# Patient Record
Sex: Male | Born: 1960 | Race: Black or African American | Hispanic: No | Smoking: Former smoker
Health system: Southern US, Community
[De-identification: ages and names within clinical notes are randomized; demographics above are authoritative.]

## PROBLEM LIST (undated history)

## (undated) DIAGNOSIS — G43909 Migraine, unspecified, not intractable, without status migrainosus: Secondary | ICD-10-CM

## (undated) DIAGNOSIS — H8110 Benign paroxysmal vertigo, unspecified ear: Secondary | ICD-10-CM

## (undated) DIAGNOSIS — Z8619 Personal history of other infectious and parasitic diseases: Secondary | ICD-10-CM

## (undated) DIAGNOSIS — N200 Calculus of kidney: Secondary | ICD-10-CM

## (undated) HISTORY — DX: Benign paroxysmal vertigo, unspecified ear: H81.10

## (undated) HISTORY — DX: Migraine, unspecified, not intractable, without status migrainosus: G43.909

## (undated) HISTORY — DX: Calculus of kidney: N20.0

## (undated) HISTORY — DX: Personal history of other infectious and parasitic diseases: Z86.19

---

## 1988-10-12 HISTORY — PX: VARICOSE VEIN SURGERY: SHX832

## 2007-03-10 DIAGNOSIS — Z720 Tobacco use: Secondary | ICD-10-CM | POA: Insufficient documentation

## 2007-03-10 DIAGNOSIS — R636 Underweight: Secondary | ICD-10-CM | POA: Insufficient documentation

## 2009-03-27 DIAGNOSIS — H8110 Benign paroxysmal vertigo, unspecified ear: Secondary | ICD-10-CM

## 2009-03-27 HISTORY — DX: Benign paroxysmal vertigo, unspecified ear: H81.10

## 2009-04-03 ENCOUNTER — Emergency Department: Payer: Self-pay | Admitting: Emergency Medicine

## 2009-04-03 ENCOUNTER — Ambulatory Visit: Payer: Self-pay | Admitting: Family Medicine

## 2009-04-11 HISTORY — PX: BRAIN SURGERY: SHX531

## 2012-03-10 ENCOUNTER — Ambulatory Visit: Payer: Self-pay | Admitting: Otolaryngology

## 2012-10-27 ENCOUNTER — Ambulatory Visit: Payer: Self-pay | Admitting: Gastroenterology

## 2012-10-28 LAB — HM COLONOSCOPY

## 2012-10-29 LAB — PATHOLOGY REPORT

## 2012-11-02 ENCOUNTER — Ambulatory Visit: Payer: Self-pay

## 2013-10-21 LAB — BASIC METABOLIC PANEL
BUN: 12 mg/dL (ref 4–21)
Creatinine: 1 mg/dL (ref 0.6–1.3)
GLUCOSE: 80 mg/dL
Potassium: 5 mmol/L (ref 3.4–5.3)
Sodium: 141 mmol/L (ref 137–147)

## 2013-10-21 LAB — CBC AND DIFFERENTIAL
HEMATOCRIT: 47 % (ref 41–53)
Hemoglobin: 16 g/dL (ref 13.5–17.5)
PLATELETS: 260 10*3/uL (ref 150–399)
WBC: 8.2 10^3/mL

## 2013-10-21 LAB — TSH: TSH: 1.39 u[IU]/mL (ref 0.41–5.90)

## 2013-10-21 LAB — PSA: PSA: 0.9

## 2014-12-16 DIAGNOSIS — N492 Inflammatory disorders of scrotum: Secondary | ICD-10-CM | POA: Insufficient documentation

## 2014-12-16 DIAGNOSIS — IMO0002 Reserved for concepts with insufficient information to code with codable children: Secondary | ICD-10-CM

## 2014-12-16 DIAGNOSIS — G43909 Migraine, unspecified, not intractable, without status migrainosus: Secondary | ICD-10-CM | POA: Insufficient documentation

## 2014-12-16 DIAGNOSIS — Z87442 Personal history of urinary calculi: Secondary | ICD-10-CM | POA: Insufficient documentation

## 2014-12-16 DIAGNOSIS — L309 Dermatitis, unspecified: Secondary | ICD-10-CM | POA: Insufficient documentation

## 2014-12-16 DIAGNOSIS — N402 Nodular prostate without lower urinary tract symptoms: Secondary | ICD-10-CM | POA: Insufficient documentation

## 2014-12-16 DIAGNOSIS — D496 Neoplasm of unspecified behavior of brain: Secondary | ICD-10-CM | POA: Insufficient documentation

## 2014-12-16 HISTORY — DX: Reserved for concepts with insufficient information to code with codable children: IMO0002

## 2014-12-20 ENCOUNTER — Encounter: Payer: Self-pay | Admitting: Family Medicine

## 2014-12-20 ENCOUNTER — Ambulatory Visit (INDEPENDENT_AMBULATORY_CARE_PROVIDER_SITE_OTHER): Payer: PRIVATE HEALTH INSURANCE | Admitting: Family Medicine

## 2014-12-20 VITALS — BP 114/80 | HR 60 | Temp 98.3°F | Resp 16 | Ht 71.0 in | Wt 128.0 lb

## 2014-12-20 DIAGNOSIS — Z720 Tobacco use: Secondary | ICD-10-CM | POA: Diagnosis not present

## 2014-12-20 DIAGNOSIS — Z0000 Encounter for general adult medical examination without abnormal findings: Secondary | ICD-10-CM | POA: Diagnosis not present

## 2014-12-20 DIAGNOSIS — Z86010 Personal history of colonic polyps: Secondary | ICD-10-CM | POA: Insufficient documentation

## 2014-12-20 DIAGNOSIS — H6123 Impacted cerumen, bilateral: Secondary | ICD-10-CM | POA: Diagnosis not present

## 2014-12-20 DIAGNOSIS — Z860101 Personal history of adenomatous and serrated colon polyps: Secondary | ICD-10-CM | POA: Insufficient documentation

## 2014-12-20 NOTE — Progress Notes (Signed)
Patient: Jeffery Grant, Male    DOB: November 14, 1960, 54 y.o.   MRN: [id number] Visit Date: 12/20/2014  Today's Provider: Lelon Huh, MD   Chief Complaint  Patient presents with  . Annual Exam   Subjective:    Annual physical exam Jeffery Grant is a 54 y.o. male who presents today for health maintenance and complete physical. He feels well. He reports exercising no. He reports he is sleeping poorly.  -----------------------------------------------------------------   Review of Systems  Constitutional: Negative.   HENT:       Ear fullness, popping, crackling  Eyes: Negative.   Respiratory: Negative.   Cardiovascular: Negative.   Gastrointestinal: Negative.   Endocrine: Negative.   Genitourinary: Negative.   Musculoskeletal: Negative.   Skin: Negative.   Allergic/Immunologic: Negative.   Neurological: Negative.   Hematological: Negative.   Psychiatric/Behavioral: Positive for sleep disturbance.       Wakes up several times a night, has been going on for around 9 years    Social History He  reports that he has been smoking Cigarettes.  He has a 30 pack-year smoking history. He does not have any smokeless tobacco history on file. He reports that he drinks alcohol. He reports that he does not use illicit drugs. Social History   Social History  . Marital Status: Married    Spouse Name: N/A  . Number of Children: 1  . Years of Education: N/A   Occupational History  . 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 Smoking status: Current Every Day Smoker -- 1.00 packs/day for 30 years    Types: Cigarettes  . Smokeless tobacco: None  . Alcohol Use: 0.0 oz/week    0 Standard drinks or equivalent per week     Comment: occasional use  . Drug Use: No  . Sexual Activity: Not Asked   Other Topics Concern  . None   Social History Narrative    Patient Active Problem List   Diagnosis Date Noted  . Scrotal nodule  12/16/2014  . 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 Dermatitis 12/16/2014  . History of kidney stones 12/16/2014  . Lesion of mucosa 12/16/2014  . Migraine 12/16/2014  . Prostate nodule 12/16/2014  . Benign paroxysmal positional vertigo 03/27/2009  . Family history of prostate cancer 08/02/2008  . Underweight 03/10/2007  . Current tobacco use 03/10/2007  . Insomnia 03/10/2007    Past Surgical History  Procedure Laterality Date  . Brain surgery  04/2009    Brain Tumor Cerebellar lesion, excised at [facility]   . Varicose vein surgery  1990's    Family History  Family Status  Relation Status Death Age  . Mother Alive     Had a Brain tumor  . Father Deceased 18    Lung cancer   His family history includes Hypertension in his other; Lung cancer in his father.    Allergies  Allergen Reactions  . Penicillins     shot form    Previous Medications   No medications on file    Patient Care Team: Birdie Sons, MD as PCP - General (Family Medicine)     Objective:   Vitals: BP 114/80 mmHg  Pulse 60  Temp(Src) 98.3 F (36.8 C) (Oral)  Resp 16  Ht 5\' 11"  (1.803 m)  Wt 128 lb (58.06 kg)  BMI 17.86 kg/m2  SpO2 99%   Physical Exam   General Appearance:  Alert, cooperative, no distress, appears stated age  Head:    Normocephalic, without obvious abnormality, atraumatic  Eyes:    PERRL, conjunctiva/corneas clear, EOM's intact, fundi    benign, both eyes       Ears:    Cerumen impaction bilaterally. Right ear canal cleared due to discomfort. Normal TM and canal after disimpaction.   Nose:   Nares normal, septum midline, mucosa normal, no drainage   or sinus tenderness  Throat:   Lips, mucosa, and tongue normal; teeth and gums normal  Neck:   Supple, symmetrical, trachea midline, no adenopathy;       thyroid:  No enlargement/tenderness/nodules; no carotid   bruit or JVD  Back:     Symmetric, no curvature, ROM normal, no CVA tenderness  Lungs:     Clear to auscultation  bilaterally, respirations unlabored  Chest wall:    No tenderness or deformity  Heart:    Regular rate and rhythm, S1 and S2 normal, no murmur, rub   or gallop  Abdomen:     Soft, non-tender, bowel sounds active all four quadrants,    no masses, no organomegaly  Genitalia:    deferred  Rectal:    deferred  Extremities:   Extremities normal, atraumatic, no cyanosis or edema  Pulses:   2+ and symmetric all extremities  Skin:   Skin color, texture, turgor normal, no rashes or lesions  Lymph nodes:   Cervical, supraclavicular, and axillary nodes normal  Neurologic:   CNII-XII intact. Normal strength, sensation and reflexes      throughout   Depression Screen PHQ 2/9 Scores 12/20/2014  PHQ - 2 Score 0  PHQ- 9 Score 0      Assessment & Plan:     Routine Health Maintenance and Physical Exam  Exercise Activities and Dietary recommendations Goals    None      Immunization History  Administered Date(s) Administered  . Tdap 07/14/2009    Health Maintenance  Topic Date Due  . HIV Screening  06/29/1977  . INFLUENZA VACCINE  12/20/2015 (Originally 09/12/2014)  . TETANUS/TDAP  07/15/2019  . COLONOSCOPY  10/29/2022  . Hepatitis C Screening  Completed      Discussed health benefits of physical activity, and encouraged him to engage in regular exercise appropriate for his age and condition.    -------------------------------------------------------------------- 1. Annual physical exam  - PSA - Comprehensive metabolic panel - Lipid panel  2. Current tobacco use Counseled on health risks associated with smoking and advised to call for prescription Zyban if he decides to try it.   3. Cerumen impaction, bilateral, Right ear discomfort Right ear irrigated until completely cleared

## 2014-12-21 LAB — LIPID PANEL
Chol/HDL Ratio: 2.6 ratio units (ref 0.0–5.0)
Cholesterol, Total: 135 mg/dL (ref 100–199)
HDL: 52 mg/dL (ref >39)
LDL CALC: 73 mg/dL (ref 0–99)
TRIGLYCERIDES: 50 mg/dL (ref 0–149)
VLDL CHOLESTEROL CAL: 10 mg/dL (ref 5–40)

## 2014-12-21 LAB — COMPREHENSIVE METABOLIC PANEL
ALT: 16 IU/L (ref 0–44)
AST: 22 IU/L (ref 0–40)
Albumin/Globulin Ratio: 2 (ref 1.1–2.5)
Albumin: 4.6 g/dL (ref 3.5–5.5)
Alkaline Phosphatase: 63 IU/L (ref 39–117)
BUN/Creatinine Ratio: 18 (ref 9–20)
BUN: 14 mg/dL (ref 6–24)
Bilirubin Total: <0.2 mg/dL (ref 0.0–1.2)
CO2: 25 mmol/L (ref 18–29)
Calcium: 9.3 mg/dL (ref 8.7–10.2)
Chloride: 102 mmol/L (ref 97–106)
Creatinine, Ser: 0.79 mg/dL (ref 0.76–1.27)
GFR calc Af Amer: 119 mL/min/{1.73_m2} (ref >59)
GFR, EST NON AFRICAN AMERICAN: 103 mL/min/{1.73_m2} (ref >59)
GLOBULIN, TOTAL: 2.3 g/dL (ref 1.5–4.5)
Glucose: 84 mg/dL (ref 65–99)
Potassium: 4 mmol/L (ref 3.5–5.2)
SODIUM: 142 mmol/L (ref 136–144)
Total Protein: 6.9 g/dL (ref 6.0–8.5)

## 2014-12-21 LAB — PSA: Prostate Specific Ag, Serum: 1 ng/mL (ref 0.0–4.0)

## 2015-12-20 ENCOUNTER — Encounter: Payer: Self-pay | Admitting: Emergency Medicine

## 2015-12-20 ENCOUNTER — Emergency Department
Admission: EM | Admit: 2015-12-20 | Discharge: 2015-12-20 | Disposition: A | Discharge status: Home / Self Care | Payer: PRIVATE HEALTH INSURANCE | Attending: Emergency Medicine | Admitting: Emergency Medicine

## 2015-12-20 ENCOUNTER — Emergency Department: Payer: PRIVATE HEALTH INSURANCE

## 2015-12-20 DIAGNOSIS — R51 Headache: Secondary | ICD-10-CM | POA: Diagnosis not present

## 2015-12-20 DIAGNOSIS — S0990XA Unspecified injury of head, initial encounter: Secondary | ICD-10-CM | POA: Diagnosis present

## 2015-12-20 DIAGNOSIS — Y999 Unspecified external cause status: Secondary | ICD-10-CM | POA: Diagnosis not present

## 2015-12-20 DIAGNOSIS — Y9389 Activity, other specified: Secondary | ICD-10-CM | POA: Insufficient documentation

## 2015-12-20 DIAGNOSIS — F17210 Nicotine dependence, cigarettes, uncomplicated: Secondary | ICD-10-CM | POA: Insufficient documentation

## 2015-12-20 DIAGNOSIS — Z85841 Personal history of malignant neoplasm of brain: Secondary | ICD-10-CM | POA: Insufficient documentation

## 2015-12-20 DIAGNOSIS — Y92410 Unspecified street and highway as the place of occurrence of the external cause: Secondary | ICD-10-CM | POA: Diagnosis not present

## 2015-12-20 MED ORDER — MELOXICAM 7.5 MG PO TABS: 7.5000 mg | ORAL_TABLET | Freq: Every day | ORAL | 2 refills | Status: DC

## 2015-12-20 MED ORDER — CYCLOBENZAPRINE HCL 5 MG PO TABS: 5.0000 mg | ORAL_TABLET | Freq: Three times a day (TID) | ORAL | 0 refills | Status: DC | PRN

## 2015-12-20 NOTE — ED Notes (Signed)
Pt returned from CT ambulatory.  

## 2015-12-20 NOTE — ED Notes (Signed)
Pt ambulatory to CT with steady gait noted.
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
## 2016-02-02 ENCOUNTER — Encounter: Payer: Self-pay | Admitting: Family Medicine

## 2016-02-02 ENCOUNTER — Ambulatory Visit (INDEPENDENT_AMBULATORY_CARE_PROVIDER_SITE_OTHER): Payer: PRIVATE HEALTH INSURANCE | Admitting: Family Medicine

## 2016-02-02 VITALS — BP 110/62 | Temp 98.9°F | Resp 16 | Ht 71.0 in | Wt 127.0 lb

## 2016-02-02 DIAGNOSIS — Z0000 Encounter for general adult medical examination without abnormal findings: Secondary | ICD-10-CM

## 2016-02-02 NOTE — Progress Notes (Signed)
Patient: Jeffery Grant, Male    DOB: 01/01/1961, 55 y.o.   MRN: [id number] Visit Date: 02/02/2016  Today's Provider: Lelon Huh, MD   Chief Complaint  Patient presents with  . Annual Exam  . Nicotine Dependence    follow up   Subjective:    Annual physical exam Jeffery Grant is a 55 y.o. male who presents today for health maintenance and complete physical. He feels well. He reports never exercising. He reports he is sleeping well.  ----------------------------------------------------------------- Follow up Tobacco use:  Patient was last seen for this problem 1 year ago. No changes were made during this visit,.. Patient was encouraged to quit smoking. Patient has tried to quit smoking since the last visit. Patient is currently using electronic cigarettes. He reports his interest in smoking cessation is high.    Wt Readings from Last 3 Encounters:  02/02/16 127 lb (57.6 kg)  12/20/15 127 lb (57.6 kg)  12/20/14 128 lb (58.1 kg)    Review of Systems  Constitutional: Negative for appetite change, chills, fatigue and fever.  HENT: Negative for congestion, ear pain, hearing loss, nosebleeds and trouble swallowing.   Eyes: Negative for pain and visual disturbance.  Respiratory: Negative for cough, chest tightness and shortness of breath.   Cardiovascular: Negative for chest pain, palpitations and leg swelling.  Gastrointestinal: Negative for abdominal pain, blood in stool, constipation, diarrhea, nausea and vomiting.  Endocrine: Negative for polydipsia, polyphagia and polyuria.  Genitourinary: Negative for dysuria and flank pain.  Musculoskeletal: Negative for arthralgias, back pain, joint swelling, myalgias and neck stiffness.  Skin: Negative for color change, rash and wound.  Neurological: Negative for dizziness, tremors, seizures, speech difficulty, weakness, light-headedness and headaches.  Psychiatric/Behavioral: Negative for behavioral problems, confusion,  decreased concentration, dysphoric mood and sleep disturbance. The patient is not nervous/anxious.   All other systems reviewed and are negative.   Social History      He  reports that he has been smoking Cigarettes.  He has a 30.00 pack-year smoking history. He has never used smokeless tobacco. He reports that he drinks alcohol. He reports that he does not use drugs.       Social History   Social History  . Marital status: Married    Spouse name: N/A  . Number of children: 1  . Years of education: N/A   Occupational History  . 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 Smoking status: Current Every Day Smoker    Packs/day: 1.00    Years: 30.00    Types: Cigarettes  . Smokeless tobacco: Never Used  . Alcohol use 0.0 oz/week     Comment: occasional use  . Drug use: No  . Sexual activity: Not Asked   Other Topics Concern  . None   Social History Narrative  . None    Past Medical History:  Diagnosis Date  . Benign paroxysmal positional vertigo 03/27/2009  . History of chicken pox   . Kidney stone   . Migraine      Patient Active Problem List   Diagnosis Date Noted  . History of adenomatous polyp of colon 12/20/2014  . Scrotal nodule 12/16/2014  . 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 Dermatitis 12/16/2014  . History of kidney stones 12/16/2014  . Lesion of mucosa 12/16/2014  . Migraine 12/16/2014  . Prostate nodule 12/16/2014  . Family history of prostate cancer 08/02/2008  . Underweight 03/10/2007  .  Current tobacco use 03/10/2007  . Insomnia 03/10/2007    Past Surgical History:  Procedure Laterality Date  . BRAIN SURGERY  04/2009   C[facility]bellar tumor [facility] at [facility]   . VARICOSE VEIN SURGERY  1990's    Family History        Family Status  Relation Status  . Mother Alive   Had a Brain tumor  . Father Deceased at age 71   Lung cancer  . Other         His family history includes Hypertension in his other; Lung  cancer in his father.     Allergies  Allergen Reactions  . Penicillins     shot form    No current outpatient prescriptions on file.   Patient Care Team: Birdie Sons, MD as PCP - General (Family Medicine)      Objective:   Vitals: BP 110/62 (BP Location: Left Arm, Patient Position: Sitting, Cuff Size: Normal)   Temp 98.9 F (37.2 C) (Oral)   Resp 16   Ht 5\' 11"  (1.803 m)   Wt 127 lb (57.6 kg)   BMI 17.71 kg/m    Physical Exam   General Appearance:    Alert, cooperative, no distress, appears stated age  Head:    Normocephalic, without obvious abnormality, atraumatic  Eyes:    PERRL, conjunctiva/corneas clear, EOM's intact, fundi    benign, both eyes       Ears:    Normal TM's and external ear canals, both ears  Nose:   Nares normal, septum midline, mucosa normal, no drainage   or sinus tenderness  Throat:   Lips, mucosa, and tongue normal; teeth and gums normal  Neck:   Supple, symmetrical, trachea midline, no adenopathy;       thyroid:  No enlargement/tenderness/nodules; no carotid   bruit or JVD  Back:     Symmetric, no curvature, ROM normal, no CVA tenderness  Lungs:     Clear to auscultation bilaterally, respirations unlabored  Chest wall:    No tenderness or deformity  Heart:    Regular rate and rhythm, S1 and S2 normal, no murmur, rub   or gallop  Abdomen:     Soft, non-tender, bowel sounds active all four quadrants,    no masses, no organomegaly  Genitalia:    deferred  Rectal:    deferred  Extremities:   Extremities normal, atraumatic, no cyanosis or edema  Pulses:   2+ and symmetric all extremities  Skin:   Skin color, texture, turgor normal, no rashes or lesions  Lymph nodes:   Cervical, supraclavicular, and axillary nodes normal  Neurologic:   CNII-XII intact. Normal strength, sensation and reflexes      throughout    Depression Screen PHQ 2/9 Scores 02/02/2016 12/20/2014  PHQ - 2 Score 0 0  PHQ- 9 Score 0 2   Current Exercise Habits: The  patient does not participate in regular exercise at present Exercise limited by: None identified     Assessment & Plan:     Routine Health Maintenance and Physical Exam  Exercise Activities and Dietary recommendations Goals    None      Immunization History  Administered Date(s) Administered  . Tdap 07/14/2009    Health Maintenance  Topic Date Due  . HIV Screening  06/29/1977  . INFLUENZA VACCINE  09/12/2015  . TETANUS/TDAP  07/15/2019  . COLONOSCOPY  10/29/2022  . Hepatitis C Screening  Completed     Discussed health benefits of physical  activity, and encouraged him to engage in regular exercise appropriate for his age and condition.    -------------------------------------------------------------------- Refused flu vaccine.   1. Annual physical exam  - Comprehensive metabolic panel - PSA  The entirety of the information documented in the History of Present Illness, Review of Systems and Physical Exam were personally obtained by me. Portions of this information were initially documented by Meyer Cory, CMA and reviewed by me for thoroughness and accuracy.     Lelon Huh, MD  [facility]

## 2016-02-02 NOTE — Patient Instructions (Signed)
Preventive Care 40-64 Years, Male Preventive care refers to lifestyle choices and visits with your health care provider that can promote health and wellness. What does preventive care include?  A yearly physical exam. This is also called an annual well check.  Dental exams once or twice a year.  Routine eye exams. Ask your health care provider how often you should have your eyes checked.  Personal lifestyle choices, including:  Daily care of your teeth and gums.  Regular physical activity.  Eating a healthy diet.  Avoiding tobacco and drug use.  Limiting alcohol use.  Practicing safe sex.  Taking low-dose aspirin every day starting at age 50. What happens during an annual well check? The services and screenings done by your health care provider during your annual well check will depend on your age, overall health, lifestyle risk factors, and family history of disease. Counseling  Your health care provider may ask you questions about your:  Alcohol use.  Tobacco use.  Drug use.  Emotional well-being.  Home and relationship well-being.  Sexual activity.  Eating habits.  Work and work environment. Screening  You may have the following tests or measurements:  Height, weight, and BMI.  Blood pressure.  Lipid and cholesterol levels. These may be checked every 5 years, or more frequently if you are over 50 years old.  Skin check.  Lung cancer screening. You may have this screening every year starting at age 55 if you have a 30-pack-year history of smoking and currently smoke or have quit within the past 15 years.  Fecal occult blood test (FOBT) of the stool. You may have this test every year starting at age 50.  Flexible sigmoidoscopy or colonoscopy. You may have a sigmoidoscopy every 5 years or a colonoscopy every 10 years starting at age 50.  Prostate cancer screening. Recommendations will vary depending on your family history and other risks.  Hepatitis C  blood test.  Hepatitis B blood test.  Sexually transmitted disease (STD) testing.  Diabetes screening. This is done by checking your blood sugar (glucose) after you have not eaten for a while (fasting). You may have this done every 1-3 years. Discuss your test results, treatment options, and if necessary, the need for more tests with your health care provider. Vaccines  Your health care provider may recommend certain vaccines, such as:  Influenza vaccine. This is recommended every year.  Tetanus, diphtheria, and acellular pertussis (Tdap, Td) vaccine. You may need a Td booster every 10 years.  Varicella vaccine. You may need this if you have not been vaccinated.  Zoster vaccine. You may need this after age 60.  Measles, mumps, and rubella (MMR) vaccine. You may need at least one dose of MMR if you were born in 1957 or later. You may also need a second dose.  Pneumococcal 13-valent conjugate (PCV13) vaccine. You may need this if you have certain conditions and have not been vaccinated.  Pneumococcal polysaccharide (PPSV23) vaccine. You may need one or two doses if you smoke cigarettes or if you have certain conditions.  Meningococcal vaccine. You may need this if you have certain conditions.  Hepatitis A vaccine. You may need this if you have certain conditions or if you travel or work in places where you may be exposed to hepatitis A.  Hepatitis B vaccine. You may need this if you have certain conditions or if you travel or work in places where you may be exposed to hepatitis B.  Haemophilus influenzae type b (Hib)   vaccine. You may need this if you have certain risk factors. Talk to your health care provider about which screenings and vaccines you need and how often you need them. This information is not intended to replace advice given to you by your health care provider. Make sure you discuss any questions you have with your health care provider. Document Released: 02/24/2015  Document Revised: 10/18/2015 Document Reviewed: 11/29/2014 Elsevier Interactive Patient Education  2017 [employer].

## 2016-02-03 LAB — COMPREHENSIVE METABOLIC PANEL
ALBUMIN: 4.7 g/dL (ref 3.5–5.5)
ALK PHOS: 66 IU/L (ref 39–117)
ALT: 12 IU/L (ref 0–44)
AST: 21 IU/L (ref 0–40)
Albumin/Globulin Ratio: 2 (ref 1.2–2.2)
BUN / CREAT RATIO: 11 (ref 9–20)
BUN: 11 mg/dL (ref 6–24)
Bilirubin Total: 0.4 mg/dL (ref 0.0–1.2)
CALCIUM: 9.6 mg/dL (ref 8.7–10.2)
CO2: 26 mmol/L (ref 18–29)
CREATININE: 0.96 mg/dL (ref 0.76–1.27)
Chloride: 104 mmol/L (ref 96–106)
GFR calc Af Amer: 104 mL/min/{1.73_m2} (ref >59)
GFR calc non Af Amer: 90 mL/min/{1.73_m2} (ref >59)
GLUCOSE: 87 mg/dL (ref 65–99)
Globulin, Total: 2.4 g/dL (ref 1.5–4.5)
Potassium: 5.4 mmol/L — ABNORMAL HIGH (ref 3.5–5.2)
Sodium: 143 mmol/L (ref 134–144)
Total Protein: 7.1 g/dL (ref 6.0–8.5)

## 2016-02-03 LAB — PSA: PROSTATE SPECIFIC AG, SERUM: 1 ng/mL (ref 0.0–4.0)

## 2016-02-06 NOTE — Progress Notes (Signed)
Advised  ED 

## 2018-02-23 ENCOUNTER — Telehealth: Payer: Self-pay

## 2018-02-23 NOTE — Telephone Encounter (Signed)
Patient reports he has not seen for over a year. Patient reports that he needs an MRI follow up for his brain tumor. Patient reports that he is having visual changes at times. Patient also c/o sharp pain in head with cough. Patient is scheduled for appt tomorrow at 8:20 am.

## 2018-02-24 ENCOUNTER — Ambulatory Visit: Payer: Self-pay | Admitting: Family Medicine

## 2018-02-24 ENCOUNTER — Encounter: Payer: Self-pay | Admitting: Family Medicine

## 2018-02-24 VITALS — BP 154/92 | HR 81 | Temp 98.6°F | Resp 16 | Wt 125.0 lb

## 2018-02-24 DIAGNOSIS — G4452 New daily persistent headache (NDPH): Secondary | ICD-10-CM | POA: Diagnosis not present

## 2018-02-24 DIAGNOSIS — Z125 Encounter for screening for malignant neoplasm of prostate: Secondary | ICD-10-CM

## 2018-02-24 DIAGNOSIS — H539 Unspecified visual disturbance: Secondary | ICD-10-CM

## 2018-02-24 DIAGNOSIS — D496 Neoplasm of unspecified behavior of brain: Secondary | ICD-10-CM | POA: Diagnosis not present

## 2018-02-24 DIAGNOSIS — R42 Dizziness and giddiness: Secondary | ICD-10-CM | POA: Diagnosis not present

## 2018-02-24 DIAGNOSIS — H499 Unspecified paralytic strabismus: Secondary | ICD-10-CM

## 2018-02-24 NOTE — Patient Instructions (Signed)
.   Please bring all of your medications to every appointment so we can make sure that our medication list is the same as yours.   
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
## 2018-02-25 LAB — CBC
HEMATOCRIT: 45.3 % (ref 37.5–51.0)
Hemoglobin: 15.3 g/dL (ref 13.0–17.7)
MCH: 30.7 pg (ref 26.6–33.0)
MCHC: 33.8 g/dL (ref 31.5–35.7)
MCV: 91 fL (ref 79–97)
PLATELETS: 221 10*3/uL (ref 150–450)
RBC: 4.98 x10E6/uL (ref 4.14–5.80)
RDW: 13.1 % (ref 11.6–15.4)
WBC: 7.7 10*3/uL (ref 3.4–10.8)

## 2018-02-25 LAB — COMPREHENSIVE METABOLIC PANEL
ALBUMIN: 4.7 g/dL (ref 3.5–5.5)
ALK PHOS: 66 IU/L (ref 39–117)
ALT: 13 IU/L (ref 0–44)
AST: 21 IU/L (ref 0–40)
Albumin/Globulin Ratio: 2.1 (ref 1.2–2.2)
BILIRUBIN TOTAL: 0.4 mg/dL (ref 0.0–1.2)
BUN / CREAT RATIO: 11 (ref 9–20)
BUN: 12 mg/dL (ref 6–24)
CO2: 23 mmol/L (ref 20–29)
Calcium: 9.5 mg/dL (ref 8.7–10.2)
Chloride: 102 mmol/L (ref 96–106)
Creatinine, Ser: 1.05 mg/dL (ref 0.76–1.27)
GFR calc Af Amer: 92 mL/min/{1.73_m2} (ref >59)
GFR calc non Af Amer: 80 mL/min/{1.73_m2} (ref >59)
GLUCOSE: 87 mg/dL (ref 65–99)
Globulin, Total: 2.2 g/dL (ref 1.5–4.5)
Potassium: 4.4 mmol/L (ref 3.5–5.2)
SODIUM: 140 mmol/L (ref 134–144)
Total Protein: 6.9 g/dL (ref 6.0–8.5)

## 2018-02-25 LAB — PSA: Prostate Specific Ag, Serum: 1 ng/mL (ref 0.0–4.0)

## 2018-02-26 ENCOUNTER — Telehealth: Payer: Self-pay

## 2018-02-26 ENCOUNTER — Inpatient Hospital Stay: Payer: PRIVATE HEALTH INSURANCE

## 2018-02-26 ENCOUNTER — Inpatient Hospital Stay
Admission: EM | Admit: 2018-02-26 | Discharge: 2018-02-27 | DRG: 064 | Disposition: A | Discharge status: Other Acute Inpatient Hospital | Payer: PRIVATE HEALTH INSURANCE | Attending: Internal Medicine | Admitting: Internal Medicine

## 2018-02-26 ENCOUNTER — Encounter: Payer: Self-pay | Admitting: Emergency Medicine

## 2018-02-26 ENCOUNTER — Other Ambulatory Visit: Payer: Self-pay

## 2018-02-26 ENCOUNTER — Ambulatory Visit
Admission: RE | Admit: 2018-02-26 | Discharge: 2018-02-26 | Disposition: A | Discharge status: Home / Self Care | Payer: PRIVATE HEALTH INSURANCE | Source: Ambulatory Visit | Attending: Family Medicine | Admitting: Family Medicine

## 2018-02-26 DIAGNOSIS — I160 Hypertensive urgency: Secondary | ICD-10-CM | POA: Diagnosis present

## 2018-02-26 DIAGNOSIS — D496 Neoplasm of unspecified behavior of brain: Secondary | ICD-10-CM | POA: Insufficient documentation

## 2018-02-26 DIAGNOSIS — Z801 Family history of malignant neoplasm of trachea, bronchus and lung: Secondary | ICD-10-CM

## 2018-02-26 DIAGNOSIS — R51 Headache: Secondary | ICD-10-CM | POA: Diagnosis present

## 2018-02-26 DIAGNOSIS — J690 Pneumonitis due to inhalation of food and vomit: Secondary | ICD-10-CM | POA: Diagnosis present

## 2018-02-26 DIAGNOSIS — G4700 Insomnia, unspecified: Secondary | ICD-10-CM | POA: Diagnosis present

## 2018-02-26 DIAGNOSIS — R42 Dizziness and giddiness: Secondary | ICD-10-CM | POA: Insufficient documentation

## 2018-02-26 DIAGNOSIS — Z978 Presence of other specified devices: Secondary | ICD-10-CM

## 2018-02-26 DIAGNOSIS — J9601 Acute respiratory failure with hypoxia: Secondary | ICD-10-CM | POA: Diagnosis present

## 2018-02-26 DIAGNOSIS — E43 Unspecified severe protein-calorie malnutrition: Secondary | ICD-10-CM

## 2018-02-26 DIAGNOSIS — H539 Unspecified visual disturbance: Secondary | ICD-10-CM

## 2018-02-26 DIAGNOSIS — I6930 Unspecified sequelae of cerebral infarction: Secondary | ICD-10-CM | POA: Diagnosis present

## 2018-02-26 DIAGNOSIS — G4452 New daily persistent headache (NDPH): Secondary | ICD-10-CM

## 2018-02-26 DIAGNOSIS — I619 Nontraumatic intracerebral hemorrhage, unspecified: Secondary | ICD-10-CM | POA: Diagnosis present

## 2018-02-26 DIAGNOSIS — I6389 Other cerebral infarction: Secondary | ICD-10-CM

## 2018-02-26 DIAGNOSIS — I639 Cerebral infarction, unspecified: Secondary | ICD-10-CM | POA: Diagnosis present

## 2018-02-26 DIAGNOSIS — Z8249 Family history of ischemic heart disease and other diseases of the circulatory system: Secondary | ICD-10-CM | POA: Diagnosis not present

## 2018-02-26 DIAGNOSIS — F17210 Nicotine dependence, cigarettes, uncomplicated: Secondary | ICD-10-CM | POA: Diagnosis present

## 2018-02-26 DIAGNOSIS — I63511 Cerebral infarction due to unspecified occlusion or stenosis of right middle cerebral artery: Secondary | ICD-10-CM | POA: Diagnosis present

## 2018-02-26 DIAGNOSIS — Z4659 Encounter for fitting and adjustment of other gastrointestinal appliance and device: Secondary | ICD-10-CM

## 2018-02-26 DIAGNOSIS — R001 Bradycardia, unspecified: Secondary | ICD-10-CM | POA: Diagnosis not present

## 2018-02-26 HISTORY — DX: Cerebral infarction, unspecified: I63.9

## 2018-02-26 LAB — BLOOD GAS, ARTERIAL
Acid-Base Excess: 3.1 mmol/L — ABNORMAL HIGH (ref 0.0–2.0)
Bicarbonate: 27 mmol/L (ref 20.0–28.0)
FIO2: 0.4
MECHVT: 500 mL
O2 Saturation: 98.2 %
PEEP: 5 cmH2O
Patient temperature: 37
RATE: 15 resp/min
pCO2 arterial: 38 mmHg (ref 32.0–48.0)
pH, Arterial: 7.46 — ABNORMAL HIGH (ref 7.350–7.450)
pO2, Arterial: 102 mmHg (ref 83.0–108.0)

## 2018-02-26 LAB — COMPREHENSIVE METABOLIC PANEL
ALT: 16 U/L (ref 0–44)
AST: 22 U/L (ref 15–41)
Albumin: 4.8 g/dL (ref 3.5–5.0)
Alkaline Phosphatase: 66 U/L (ref 38–126)
Anion gap: 6 (ref 5–15)
BUN: 12 mg/dL (ref 6–20)
CALCIUM: 9.3 mg/dL (ref 8.9–10.3)
CO2: 28 mmol/L (ref 22–32)
CREATININE: 0.83 mg/dL (ref 0.61–1.24)
Chloride: 106 mmol/L (ref 98–111)
GFR calc non Af Amer: >60 mL/min (ref >60)
Glucose, Bld: 92 mg/dL (ref 70–99)
Potassium: 3.8 mmol/L (ref 3.5–5.1)
Sodium: 140 mmol/L (ref 135–145)
Total Bilirubin: 0.8 mg/dL (ref 0.3–1.2)
Total Protein: 7.9 g/dL (ref 6.5–8.1)

## 2018-02-26 LAB — DIFFERENTIAL
Abs Immature Granulocytes: 0.03 10*3/uL (ref 0.00–0.07)
BASOS ABS: 0.1 10*3/uL (ref 0.0–0.1)
BASOS PCT: 1 %
Eosinophils Absolute: 0.2 10*3/uL (ref 0.0–0.5)
Eosinophils Relative: 2 %
Immature Granulocytes: 0 %
Lymphocytes Relative: 24 %
Lymphs Abs: 2.4 10*3/uL (ref 0.7–4.0)
MONO ABS: 0.9 10*3/uL (ref 0.1–1.0)
Monocytes Relative: 9 %
Neutro Abs: 6.5 10*3/uL (ref 1.7–7.7)
Neutrophils Relative %: 64 %

## 2018-02-26 LAB — APTT: APTT: 32 s (ref 24–36)

## 2018-02-26 LAB — CBC
HCT: 48.6 % (ref 39.0–52.0)
Hemoglobin: 15.9 g/dL (ref 13.0–17.0)
MCH: 30.8 pg (ref 26.0–34.0)
MCHC: 32.7 g/dL (ref 30.0–36.0)
MCV: 94.2 fL (ref 80.0–100.0)
Platelets: 211 10*3/uL (ref 150–400)
RBC: 5.16 MIL/uL (ref 4.22–5.81)
RDW: 13.9 % (ref 11.5–15.5)
WBC: 10 10*3/uL (ref 4.0–10.5)
nRBC: 0 % (ref 0.0–0.2)

## 2018-02-26 LAB — TROPONIN I: Troponin I: <0.03 ng/mL (ref <0.03)

## 2018-02-26 LAB — GLUCOSE, CAPILLARY
GLUCOSE-CAPILLARY: 99 mg/dL (ref 70–99)
Glucose-Capillary: 86 mg/dL (ref 70–99)
Glucose-Capillary: 91 mg/dL (ref 70–99)

## 2018-02-26 LAB — TRIGLYCERIDES: Triglycerides: 49 mg/dL (ref <150)

## 2018-02-26 LAB — MRSA PCR SCREENING: MRSA by PCR: NEGATIVE

## 2018-02-26 LAB — PROTIME-INR
INR: 0.93
Prothrombin Time: 12.4 seconds (ref 11.4–15.2)

## 2018-02-26 LAB — FIBRINOGEN: Fibrinogen: 469 mg/dL (ref 210–475)

## 2018-02-26 LAB — C-REACTIVE PROTEIN: CRP: <0.8 mg/dL (ref <1.0)

## 2018-02-26 MED ORDER — ROCURONIUM BROMIDE 50 MG/5ML IV SOLN
50.0000 mg | Freq: Once | INTRAVENOUS | Status: AC
  Administered 2018-02-26: 50 mg via INTRAVENOUS

## 2018-02-26 MED ORDER — LORAZEPAM 2 MG/ML IJ SOLN: 2.0000 mg | INTRAMUSCULAR | Status: DC | PRN

## 2018-02-26 MED ORDER — NICARDIPINE HCL IN NACL 20-0.86 MG/200ML-% IV SOLN
3.0000 mg/h | INTRAVENOUS | Status: DC
  Administered 2018-02-27: 3 mg/h via INTRAVENOUS
  Administered 2018-02-27: 5 mg/h via INTRAVENOUS
  Filled 2018-02-26 (×2): qty 200

## 2018-02-26 MED ORDER — ETOMIDATE 2 MG/ML IV SOLN
20.0000 mg | Freq: Once | INTRAVENOUS | Status: AC
  Administered 2018-02-26: 20 mg via INTRAVENOUS

## 2018-02-26 MED ORDER — IPRATROPIUM-ALBUTEROL 0.5-2.5 (3) MG/3ML IN SOLN: 3.0000 mL | RESPIRATORY_TRACT | Status: DC | PRN

## 2018-02-26 MED ORDER — GADOBUTROL 1 MMOL/ML IV SOLN
5.0000 mL | Freq: Once | INTRAVENOUS | Status: AC | PRN
  Administered 2018-02-26: 5 mL via INTRAVENOUS

## 2018-02-26 MED ORDER — NOREPINEPHRINE-SODIUM CHLORIDE 4-0.9 MG/250ML-% IV SOLN
INTRAVENOUS | Status: AC
  Filled 2018-02-26: qty 250

## 2018-02-26 MED ORDER — FENTANYL CITRATE (PF) 100 MCG/2ML IJ SOLN
INTRAMUSCULAR | Status: AC
  Administered 2018-02-26: 100 ug via INTRAVENOUS
  Filled 2018-02-26: qty 2

## 2018-02-26 MED ORDER — INSULIN ASPART 100 UNIT/ML ~~LOC~~ SOLN: 0.0000 [IU] | SUBCUTANEOUS | Status: DC

## 2018-02-26 MED ORDER — SODIUM CHLORIDE 0.9 % IV SOLN
3.0000 g | Freq: Three times a day (TID) | INTRAVENOUS | Status: DC
  Administered 2018-02-27 (×2): 3 g via INTRAVENOUS
  Filled 2018-02-26 (×5): qty 3

## 2018-02-26 MED ORDER — PROPOFOL 1000 MG/100ML IV EMUL
0.0000 ug/kg/min | INTRAVENOUS | Status: DC
  Administered 2018-02-26: 10 ug/kg/min via INTRAVENOUS
  Administered 2018-02-27: 30 ug/kg/min via INTRAVENOUS
  Administered 2018-02-27: 50 ug/kg/min via INTRAVENOUS
  Filled 2018-02-26 (×4): qty 100

## 2018-02-26 MED ORDER — ONDANSETRON HCL 4 MG PO TABS: 4.0000 mg | ORAL_TABLET | Freq: Four times a day (QID) | ORAL | Status: DC | PRN

## 2018-02-26 MED ORDER — ONDANSETRON HCL 4 MG/2ML IJ SOLN: 4.0000 mg | Freq: Four times a day (QID) | INTRAMUSCULAR | Status: DC | PRN

## 2018-02-26 MED ORDER — FENTANYL CITRATE (PF) 100 MCG/2ML IJ SOLN
100.0000 ug | INTRAMUSCULAR | Status: DC | PRN
  Filled 2018-02-26 (×2): qty 2

## 2018-02-26 MED ORDER — NIMODIPINE 30 MG PO CAPS
30.0000 mg | ORAL_CAPSULE | ORAL | Status: DC
  Administered 2018-02-26 – 2018-02-27 (×4): 30 mg via ORAL
  Filled 2018-02-26 (×13): qty 1

## 2018-02-26 MED ORDER — CHLORHEXIDINE GLUCONATE 0.12% ORAL RINSE (MEDLINE KIT)
15.0000 mL | Freq: Two times a day (BID) | OROMUCOSAL | Status: DC
  Administered 2018-02-27: 15 mL via OROMUCOSAL

## 2018-02-26 MED ORDER — HYDRALAZINE HCL 20 MG/ML IJ SOLN
10.0000 mg | INTRAMUSCULAR | Status: DC | PRN
  Administered 2018-02-26: 10 mg via INTRAVENOUS
  Administered 2018-02-26: 20 mg via INTRAVENOUS
  Administered 2018-02-26: 10 mg via INTRAVENOUS
  Filled 2018-02-26 (×3): qty 1

## 2018-02-26 MED ORDER — ORAL CARE MOUTH RINSE
15.0000 mL | OROMUCOSAL | Status: DC
  Administered 2018-02-26 – 2018-02-27 (×8): 15 mL via OROMUCOSAL

## 2018-02-26 MED ORDER — IOHEXOL 350 MG/ML SOLN
75.0000 mL | Freq: Once | INTRAVENOUS | Status: AC | PRN
  Administered 2018-02-26: 75 mL via INTRAVENOUS

## 2018-02-26 MED ORDER — NOREPINEPHRINE-SODIUM CHLORIDE 4-0.9 MG/250ML-% IV SOLN
0.0000 ug/min | INTRAVENOUS | Status: DC
  Administered 2018-02-26: 4 ug/min via INTRAVENOUS

## 2018-02-26 MED ORDER — ETOMIDATE 2 MG/ML IV SOLN
INTRAVENOUS | Status: AC
  Administered 2018-02-26: 20 mg via INTRAVENOUS
  Filled 2018-02-26: qty 10

## 2018-02-26 MED ORDER — FENTANYL CITRATE (PF) 100 MCG/2ML IJ SOLN
200.0000 ug | Freq: Once | INTRAMUSCULAR | Status: AC
  Administered 2018-02-26: 100 ug via INTRAVENOUS

## 2018-02-26 MED ORDER — FENTANYL CITRATE (PF) 100 MCG/2ML IJ SOLN
100.0000 ug | INTRAMUSCULAR | Status: DC | PRN
  Administered 2018-02-26: 100 ug via INTRAVENOUS

## 2018-02-26 MED ORDER — ROCURONIUM BROMIDE 50 MG/5ML IV SOLN
INTRAVENOUS | Status: AC
  Administered 2018-02-26: 50 mg via INTRAVENOUS
  Filled 2018-02-26: qty 1

## 2018-02-26 MED ORDER — FAMOTIDINE IN NACL 20-0.9 MG/50ML-% IV SOLN
20.0000 mg | INTRAVENOUS | Status: DC
  Administered 2018-02-26: 20 mg via INTRAVENOUS
  Filled 2018-02-26: qty 50

## 2018-02-26 NOTE — ED Notes (Signed)
This RN attempted report. Judson Roch, RN informed this RN that bed has not been approved yet by charge nurse and to wait until bed is approved before report can be taken.

## 2018-02-26 NOTE — Procedures (Signed)
Endotracheal Intubation Procedure Note  Indication for endotracheal intubation: airway compromise. Airway Assessment: Mallampati Class: I (soft palate, uvula, fauces, and tonsillar pillars visible). Sedation: etomidate and fentanyl. Paralytic: rocuronium. Lidocaine: no. Atropine: no. Equipment: glidescope utilized  Cricoid Pressure: no. Number of attempts: 1. ETT location confirmed by by auscultation, by CXR and ETCO2 monitor.  Pt emergently intubated for airway protection no complications noted during and following procedure.  Marda Stalker, [place] Pager [phone] (please enter [phone] digits) PCCM Consult Pager [phone] (please enter [phone] digits)

## 2018-02-26 NOTE — Telephone Encounter (Signed)
Britney from imaging center is calling that patient insurance [employer] is requiring authorization for the MRI. Her call back number is [phone] ext. [phone]
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
## 2018-02-26 NOTE — H&P (Signed)
[facility] NAME: Jeffery Grant    MR#:  [id number]  DATE OF BIRTH:  07-19-1960  DATE OF ADMISSION:  02/26/2018  PRIMARY CARE PHYSICIAN: Birdie Sons, MD   REQUESTING/REFERRING PHYSICIAN: Earleen Newport, MD  CHIEF COMPLAINT:   Blurry vision HISTORY OF PRESENT ILLNESS:  Jeffery Grant  is a 58 y.o. male with a known history of migraine headaches, insomnia, came into the ED with a chief complaint of blurry vision.  Patient reports that he could not discharge the distance as usual.  He has noticed some change of perception of the distance and he was off balance since Saturday.  She had outpatient MRI as ordered by his primary care physician and which has revealed hemorrhage.  Patient came into the ED for further evaluation.  He has passed bedside swallow evaluation and speech was clear denies any weakness.  Wife at bedside.  Evaluated by neurology in the ED.  Neurosurgery was consulted by the ED physician who has not recommended no interventions at this time  PAST MEDICAL HISTORY:   Past Medical History:  Diagnosis Date  . Benign paroxysmal positional vertigo 03/27/2009  . History of chicken pox   . Kidney stone   . Migraine     PAST SURGICAL HISTOIRY:   Past Surgical History:  Procedure Laterality Date  . BRAIN SURGERY  04/2009   Cerebellar tumor excised at [facility]   . VARICOSE VEIN SURGERY  1990's    SOCIAL HISTORY:   Social History   Tobacco Use  . Smoking status: Current Every Day Smoker    Packs/day: 1.00    Years: 30.00    Pack years: 30.00    Types: Cigarettes  . Smokeless tobacco: Never Used  Substance Use Topics  . Alcohol use: Yes    Alcohol/week: 0.0 standard drinks    Comment: occasional use    FAMILY HISTORY:   Family History  Problem Relation Age of Onset  . Lung cancer Father   . Hypertension Other     DRUG ALLERGIES:   Allergies  Allergen Reactions  . Penicillins    shot form    REVIEW OF SYSTEMS:  CONSTITUTIONAL: No fever, fatigue or weakness.  EYES: No double vision.  Reporting blurry vision EARS, NOSE, AND THROAT: No tinnitus or ear pain.  RESPIRATORY: No cough, shortness of breath, wheezing or hemoptysis.  CARDIOVASCULAR: No chest pain, orthopnea, edema.  GASTROINTESTINAL: No nausea, vomiting, diarrhea or abdominal pain.  GENITOURINARY: No dysuria, hematuria.  ENDOCRINE: No polyuria, nocturia,  HEMATOLOGY: No anemia, easy bruising or bleeding SKIN: No rash or lesion. MUSCULOSKELETAL: No joint pain or arthritis.   NEUROLOGIC: Reports off balance no tingling, numbness, weakness.  PSYCHIATRY: No anxiety or depression.   MEDICATIONS AT HOME:   Prior to Admission medications   Not on File      VITAL SIGNS:  Blood pressure (!) 179/114, pulse 63, temperature 98.1 F (36.7 C), temperature source Oral, resp. rate 13, SpO2 100 %.  PHYSICAL EXAMINATION:  GENERAL:  58 y.o.-year-old patient lying in the bed with no acute distress.  EYES: Pupils equal, round, reactive to light and accommodation. No scleral icterus. Extraocular muscles intact.  HEENT: Head atraumatic, normocephalic. Oropharynx and nasopharynx clear.  NECK:  Supple, no jugular venous distention. No thyroid enlargement, no tenderness.  LUNGS: Normal breath sounds bilaterally, no wheezing, rales,rhonchi or crepitation. No use of accessory muscles of respiration.  CARDIOVASCULAR: S1, S2 normal. No murmurs, rubs, or  gallops.  ABDOMEN: Soft, nontender, nondistended. Bowel sounds present. No organomegaly or mass.  EXTREMITIES: No pedal edema, cyanosis, or clubbing.  NEUROLOGIC: Cranial nerves II through XII are intact. Muscle strength 5/5 in all extremities. Sensation intact. Gait not checked.  PSYCHIATRIC: The patient is alert and oriented x 3.  SKIN: No obvious rash, lesion, or ulcer.   LABORATORY PANEL:   CBC Recent Labs  Lab 02/26/18 1139  WBC 10.0  HGB 15.9  HCT 48.6  PLT  211   ------------------------------------------------------------------------------------------------------------------  Chemistries  Recent Labs  Lab 02/26/18 1139  NA 140  K 3.8  CL 106  CO2 28  GLUCOSE 92  BUN 12  CREATININE 0.83  CALCIUM 9.3  AST 22  ALT 16  ALKPHOS 66  BILITOT 0.8   ------------------------------------------------------------------------------------------------------------------  Cardiac Enzymes Recent Labs  Lab 02/26/18 1139  TROPONINI <0.03   ------------------------------------------------------------------------------------------------------------------  RADIOLOGY:  Mr Jeri Cos Wo Contrast  Result Date: 02/26/2018 CLINICAL DATA:  New persistent daily headache. History of cerebellar tumor, surgical resection showing hemangioma by records at [facility]. Visual disturbance and dizziness. EXAM: MRI HEAD WITHOUT AND WITH CONTRAST TECHNIQUE: Multiplanar, multiecho pulse sequences of the brain and surrounding structures were obtained without and with intravenous contrast. CONTRAST:  5 mL Gadovist IV COMPARISON:  MRI head 04/03/2009.  CT head 12/20/2015 FINDINGS: Brain: Restricted diffusion in the right occipital parietal lobe with mild hemorrhage. This measures approximately 3 x 4 cm. Mild gyriform enhancement is present within the abnormality. There is cortical edema. Findings most consistent with subacute infarct given the edema and enhancement. Small acute infarct in the right frontal lobe. Ventricle size normal. No midline shift. 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 Mild encephalomalacia in the vermis without evidence of acute hemorrhage or mass. Vascular: Normal arterial flow voids Skull and upper cervical spine: No acute abnormality Sinuses/Orbits: Air-fluid level left sphenoid sinus. Mild mucosal edema in the paranasal sinuses. Normal orbit Other: None IMPRESSION: Acute/subacute hemorrhagic infarct right  occipital parietal lobe, likely several days old given the enhancement. Small area of recent infarct in the right frontal lobe. Possible cerebral emboli. Suboccipital craniotomy in the midline for resection of cerebellar vermis hemorrhage related to hemangioma based on prior reports at [facility]. These results were called by telephone at the time of interpretation on 02/26/2018 at 10:55 am to Dr. Lelon Huh , who verbally acknowledged these results. Electronically Signed   By: Franchot Gallo M.D.   On: 02/26/2018 10:55    EKG:   Orders placed or performed in visit on 02/26/18  . EKG 12-Lead    IMPRESSION AND PLAN:   #Acute hemorrhagic stroke Admit to stepdown unit as recommended by neurology CT head with hemorrhagic stroke; will get repeat CT scan of the head Stroke work-up per protocol Cannot give aspirin or Plavix Neurology consult placed and discussed with neurology Intensivist notified  #Hypertensive urgency with no previous history of essential hypertension IV Lopressor as needed  #Insomnia-Benadryl as needed  #Migraine headache-currently patient is asymptomatic   #Tobacco abuse disorder Counseled patient to quit smoking for 5 minutes.  He verbalized understanding and he is not sure whether he needs nicotine patch at this time   All the records are reviewed and case discussed with ED provider. Management plans discussed with the patient, family and they are in agreement.  CODE STATUS: fc , wife is healthcare POA  TOTAL CRITICAL CARE TIME TAKING CARE OF THIS PATIENT: 43 minutes.   Note: This dictation was  prepared with Dragon dictation along with smaller phrase technology. Any transcriptional errors that result from this process are unintentional.  Nicholes Mango M.D on 02/26/2018 at 2:46 PM  Between 7am to 6pm - Pager - [phone]  After 6pm go to [email or web address] - password [email or web address] [facility]  Office  [phone]  CC: Primary care  physician; Birdie Sons, MD

## 2018-02-26 NOTE — Progress Notes (Signed)
CODE STROKE- PHARMACY COMMUNICATION   Time CODE STROKE called/page received:1914  Time response to CODE STROKE was made (in person or via phone): 1925  Time Stroke Kit retrieved from Pyxis (only if needed): n/a  Name of Provider/Nurse contacted: Jeremiah Keene, NP  Past Medical History:  Diagnosis Date  . Benign paroxysmal positional vertigo 03/27/2009  . History of chicken pox   . Kidney stone   . Migraine    Prior to Admission medications   Not on File    Chris A Nesbitt ,PharmD Clinical Pharmacist  02/26/2018  7:15 PM  

## 2018-02-26 NOTE — ED Provider Notes (Signed)
[facility] Emergency Department Provider Note       Time seen: ----------------------------------------- 12:33 PM on 02/26/2018 -----------------------------------------   I have reviewed the triage vital signs and the nursing notes.  HISTORY   Chief Complaint Transient Ischemic Attack    HPI Jeffery Grant is a 58 y.o. male with a history of kidney stones, migraines who presents to the ED for possible stroke on MRI.  Patient states he has had a change of perception is been off balance since Saturday.  He was seen by his doctor and had outpatient MRI ordered.  He was told the MRI was positive for hemorrhage today.  He arrives with clear speech, does not take any anticoagulation.  Past Medical History:  Diagnosis Date  . Benign paroxysmal positional vertigo 03/27/2009  . History of chicken pox   . Kidney stone   . Migraine     Patient Active Problem List   Diagnosis Date Noted  . History of adenomatous polyp of colon 12/20/2014  . Scrotal nodule 12/16/2014  . 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 Dermatitis 12/16/2014  . History of kidney stones 12/16/2014  . Lesion of mucosa 12/16/2014  . Migraine 12/16/2014  . Prostate nodule 12/16/2014  . Family history of prostate cancer 08/02/2008  . Underweight 03/10/2007  . Current tobacco use 03/10/2007  . Insomnia 03/10/2007    Past Surgical History:  Procedure Laterality Date  . BRAIN SURGERY  04/2009   Cerebellar tumor excised at [facility]   . VARICOSE VEIN SURGERY  1990's    Allergies Penicillins  Social History Social History   Tobacco Use  . Smoking status: Current Every Day Smoker    Packs/day: 1.00    Years: 30.00    Pack years: 30.00    Types: Cigarettes  . Smokeless tobacco: Never Used  Substance Use Topics  . Alcohol use: Yes    Alcohol/week: 0.0 standard drinks    Comment: occasional use  . Drug use: No    Review of Systems Constitutional: Negative for  fever. Cardiovascular: Negative for chest pain. Respiratory: Negative for shortness of breath. Gastrointestinal: Negative for abdominal pain, vomiting and diarrhea. Musculoskeletal: Negative for back pain. Skin: Negative for rash. Neurological: Positive for change with depth perception and balance  All systems negative/normal/unremarkable except as stated in the HPI  ____________________________________________   PHYSICAL EXAM:  VITAL SIGNS: ED Triage Vitals [02/26/18 1128]  Enc Vitals Group     BP (!) 147/91     Pulse Rate 62     Resp 16     Temp 98.1 F (36.7 C)     Temp Source Oral     SpO2 100 %     Weight      Height      Head Circumference      Peak Flow      Pain Score 0     Pain Loc      Pain Edu?      Excl. in [place]?    Constitutional: Alert and oriented. Well appearing and in no distress. Eyes: Conjunctivae are normal. Normal extraocular movements. ENT      Head: Normocephalic and atraumatic.      Nose: No congestion/rhinnorhea.      Mouth/Throat: Mucous membranes are moist.      Neck: No stridor. Cardiovascular: Normal rate, regular rhythm. No murmurs, rubs, or gallops. Respiratory: Normal respiratory effort without tachypnea nor retractions. Breath sounds are clear and equal bilaterally. No wheezes/rales/rhonchi. Gastrointestinal: Soft and nontender.  Normal bowel sounds Musculoskeletal: Nontender with normal range of motion in extremities. No lower extremity tenderness nor edema. Neurologic:  Normal speech and language. No gross focal neurologic deficits are appreciated.  Some ataxia is noted.  Otherwise strength, sensation, cranial nerves are unremarkable Skin:  Skin is warm, dry and intact. No rash noted. Psychiatric: Mood and affect are normal. Speech and behavior are normal.  ____________________________________________  EKG: Interpreted by me.  Sinus rhythm rate 62 bpm, rightward axis, normal QRS, normal  QT  ____________________________________________  ED COURSE:  As part of my medical decision making, I reviewed the following data within the [place] History obtained from family if available, nursing notes, old chart and ekg, as well as notes from prior ED visits. 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 Small area of recent infarct in the right frontal lobe. Possible cerebral emboli.  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  These results were called by telephone at the time of interpretation on 02/26/2018 at 10:55 am to Dr. Lelon Huh , who verbally acknowledged these results.   ____________________________________________   DIFFERENTIAL DIAGNOSIS   Hemorrhagic infarct, tumor, anticoagulation  FINAL ASSESSMENT AND PLAN  Hemorrhagic infarct   Plan: The patient had presented for hemorrhagic infarct seen on MRI. Patient's labs were reassuring. Patient's imaging revealed infarcts.  He is not on any anticoagulation.  I will discuss with neurosurgery and neurology.  Neurosurgery does not feel like this warrants any acute intervention.   Laurence Aly, MD    Note: This note was generated in part or whole with voice recognition software. Voice recognition is usually quite accurate but there are transcription errors that can and very often do occur. I apologize for any typographical errors that  were not detected and corrected.     Earleen Newport, MD 02/26/18 [phone]

## 2018-02-26 NOTE — Progress Notes (Signed)
Pharmacy Antibiotic Note  Jeffery Grant is a 58 y.o. male admitted on 02/26/2018 with aspiration pneumonia.  Pharmacy has been consulted for Unasyn dosing.  Plan: Will start Unasyn 3g IV q8h  Height: 5\' 11"  (180.3 cm) Weight: 115 lb 11.9 oz (52.5 kg) IBW/kg (Calculated) : 75.3  Temp (24hrs), Avg:98 F (36.7 C), Min:97.6 F (36.4 C), Max:98.2 F (36.8 C)  Recent Labs  Lab 02/24/18 0914 02/26/18 1139  WBC 7.7 10.0  CREATININE 1.05 0.83    Estimated Creatinine Clearance: 74.7 mL/min (by C-G formula based on SCr of 0.83 mg/dL).    Allergies  Allergen Reactions  . Penicillins     shot form    Thank you for allowing pharmacy to be a part of this patient's care.  Tobie Lords, PharmD, BCPS Clinical Pharmacist 02/26/2018

## 2018-02-26 NOTE — Progress Notes (Signed)
This RN was going to hang meds in another room when she saw that pt's monitor was alarming bradycardia with rates in the 30s and 40s. At the same time another RN came out of the room asking if pt had prior been alert and oriented. This RN went into room, pt diaphoretic, unresponsive to pain, with HR in the 30s and 40s. Hinton Dyer, NP to bedside as well as several other nursing staff. Decision made by Hinton Dyer, NP to intubate pt for airway protection. Pt intubated by Hinton Dyer, NP, then taken to CT for repeat head CT. After intubation HR improved to SR in 60s and 70s, however pt hypertensive with SBPs as high as 210s. Darlyn Chamber, NP coming on aware.
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
## 2018-02-26 NOTE — Progress Notes (Signed)
   02/26/18 2100  Clinical Encounter Type  Visited With Family  Visit Type Follow-up;Spiritual support  Referral From Family;Nurse  Consult/Referral To Chaplain  Spiritual Encounters  Spiritual Needs Prayer;Emotional  Chaplain follow up with family, wife had arrived. Chaplain walked back with wife and PA asked if wife could come back after neurologist finished examining patient. Chaplain walked back with wife Tammy and sister Golden Circle to waiting area. Chaplain went back to check on patient and it was ok for family to visit. Chaplain went to get Tammy  and [place] and they talk with PA Hinton Dyer) she explained to family that patient needed breathing tube to keeping airway clear due to vomiting. Chaplain comfort wife Tammy, she was having trouble breathing, she had just finished breathing treatment before arriving at the hospital. Chaplain encourage patient to slow down and breathe slowly. Tammy was getting anxious. Chaplain prayed with Tammy and [place] and offered them to call if they need Chaplain.

## 2018-02-26 NOTE — Addendum Note (Signed)
Addended by: Birdie Sons on: 02/26/2018 08:22 AM   Modules accepted: Orders

## 2018-02-26 NOTE — Telephone Encounter (Signed)
Do you know if they still need this.... the MRI was already done and patient had CVA

## 2018-02-26 NOTE — Progress Notes (Signed)
Pt became bradycardic hr upper 30's on cardiac monitor, hypotensive sbp 70's, unresponsive, and diaphoretic. He began to vomit large amounts of food and only had non purposeful movement of the right upper extremity. Therefore, he required mechanical intubation for airway protection.  Stat CT head ordered and Code stroke initiated post intubation. Attempted to contact Dr. Paschal Dopp neurologist who examined the pt upon arrival to the ER regarding changes in pt condition, however MD no longer on call.  I spoke with tele-neurologist Dr. Jone Baseman regarding change in pt condition, he stated a tele-neurologist would examine the pt.  Stat repeat CT Head revealed stable distribution of right parietal lobe subacute infarction and petechial hemorrhage in comparison with prior MRI given differences in technique. No new acute intracranial abnormality identified. ASPECTS is 10.  I updated pts sister regarding change in pt condition, she stated she would contact the pts wife.  Marda Stalker, [place] Pager [phone] (please enter 7 digits) PCCM Consult Pager [phone] (please enter 7 digits)

## 2018-02-26 NOTE — ED Triage Notes (Signed)
PT sent by PCP for + stroke on MRI. PT states change in perception and off balance since Saturday. PT A&OX4, NAD noted. Speech clear. PT denies any anticoag use.

## 2018-02-26 NOTE — Progress Notes (Signed)
   02/26/18 1900  Clinical Encounter Type  Visited With Family  Visit Type Initial  Referral From Nurse  Consult/Referral To Chaplain;Palliative care  Spiritual Encounters  Spiritual Needs Emotional  Chaplain received PG for code stroke. Chaplain went to room and patient was taken down to do a CAT Scan. Chaplain ask was there any family members and nurse said there was a woman and advice Chaplain to check the 2A waiting room. Chaplain went there and patient sister Jeffery Grant was there and Chaplain introduced herself and Jeffery Grant told Chaplain she called patient wife Jeffery Grant and she was on her way back to the hospital. Chaplain ask Jeffery Grant was there anything she could do for her and she said no. Patient friend Jeffery Grant arrived and Chaplain introduced herself him as well. He referred himself as like a brother to patient. [place] and [place] are concerned about patient and how hard he remains to work even if when not feeling well.  Chaplain told Jeffery Grant she will return shortly and Jeffery Grant said ok. Chaplain wanted to give wife Jeffery Grant time to arrive.
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
## 2018-02-27 ENCOUNTER — Inpatient Hospital Stay: Payer: PRIVATE HEALTH INSURANCE

## 2018-02-27 ENCOUNTER — Inpatient Hospital Stay (HOSPITAL_COMMUNITY)
Admit: 2018-02-27 | Discharge: 2018-02-27 | Disposition: A | Discharge status: Home / Self Care | Payer: PRIVATE HEALTH INSURANCE | Attending: Nurse Practitioner | Admitting: Nurse Practitioner

## 2018-02-27 DIAGNOSIS — E43 Unspecified severe protein-calorie malnutrition: Secondary | ICD-10-CM

## 2018-02-27 DIAGNOSIS — R001 Bradycardia, unspecified: Secondary | ICD-10-CM

## 2018-02-27 DIAGNOSIS — I639 Cerebral infarction, unspecified: Secondary | ICD-10-CM

## 2018-02-27 HISTORY — DX: Unspecified severe protein-calorie malnutrition: E43

## 2018-02-27 LAB — GLUCOSE, CAPILLARY
GLUCOSE-CAPILLARY: 82 mg/dL (ref 70–99)
Glucose-Capillary: 67 mg/dL — ABNORMAL LOW (ref 70–99)
Glucose-Capillary: 95 mg/dL (ref 70–99)
Glucose-Capillary: 89 mg/dL (ref 70–99)
Glucose-Capillary: 90 mg/dL (ref 70–99)

## 2018-02-27 LAB — COMPREHENSIVE METABOLIC PANEL
ALK PHOS: 61 U/L (ref 38–126)
ALT: 12 U/L (ref 0–44)
AST: 20 U/L (ref 15–41)
Albumin: 4 g/dL (ref 3.5–5.0)
Anion gap: 8 (ref 5–15)
BILIRUBIN TOTAL: 0.6 mg/dL (ref 0.3–1.2)
BUN: 15 mg/dL (ref 6–20)
CO2: 24 mmol/L (ref 22–32)
Calcium: 8.7 mg/dL — ABNORMAL LOW (ref 8.9–10.3)
Chloride: 107 mmol/L (ref 98–111)
Creatinine, Ser: 0.92 mg/dL (ref 0.61–1.24)
GFR calc Af Amer: >60 mL/min (ref >60)
GFR calc non Af Amer: >60 mL/min (ref >60)
Glucose, Bld: 97 mg/dL (ref 70–99)
Potassium: 3.6 mmol/L (ref 3.5–5.1)
Sodium: 139 mmol/L (ref 135–145)
Total Protein: 6.8 g/dL (ref 6.5–8.1)

## 2018-02-27 LAB — PROTEIN ELECTROPHORESIS, SERUM
A/G Ratio: 1.4 (ref 0.7–1.7)
ALBUMIN ELP: 4.2 g/dL (ref 2.9–4.4)
Alpha-1-Globulin: 0.3 g/dL (ref 0.0–0.4)
Alpha-2-Globulin: 0.8 g/dL (ref 0.4–1.0)
Beta Globulin: 0.8 g/dL (ref 0.7–1.3)
Gamma Globulin: 1 g/dL (ref 0.4–1.8)
Globulin, Total: 2.9 g/dL (ref 2.2–3.9)
Total Protein ELP: 7.1 g/dL (ref 6.0–8.5)

## 2018-02-27 LAB — BLOOD GAS, ARTERIAL
Acid-Base Excess: 0.8 mmol/L (ref 0.0–2.0)
BICARBONATE: 25.3 mmol/L (ref 20.0–28.0)
FIO2: 0.4
MECHVT: 500 mL
O2 Saturation: 99.1 %
PEEP: 5 cmH2O
Patient temperature: 37
RATE: 15 resp/min
pCO2 arterial: 39 mmHg (ref 32.0–48.0)
pH, Arterial: 7.42 (ref 7.350–7.450)
pO2, Arterial: 135 mmHg — ABNORMAL HIGH (ref 83.0–108.0)

## 2018-02-27 LAB — CBC
HCT: 45.7 % (ref 39.0–52.0)
Hemoglobin: 14.8 g/dL (ref 13.0–17.0)
MCH: 30.2 pg (ref 26.0–34.0)
MCHC: 32.4 g/dL (ref 30.0–36.0)
MCV: 93.3 fL (ref 80.0–100.0)
Platelets: 212 10*3/uL (ref 150–400)
RBC: 4.9 MIL/uL (ref 4.22–5.81)
RDW: 14 % (ref 11.5–15.5)
WBC: 12.7 10*3/uL — ABNORMAL HIGH (ref 4.0–10.5)
nRBC: 0 % (ref 0.0–0.2)

## 2018-02-27 LAB — HIV ANTIBODY (ROUTINE TESTING W REFLEX): HIV Screen 4th Generation wRfx: NONREACTIVE

## 2018-02-27 LAB — PROTIME-INR
INR: 0.97
Prothrombin Time: 12.8 seconds (ref 11.4–15.2)

## 2018-02-27 LAB — PROCALCITONIN: Procalcitonin: <0.1 ng/mL

## 2018-02-27 LAB — HOMOCYSTEINE: Homocysteine: 13.9 umol/L (ref 0.0–15.0)

## 2018-02-27 MED ORDER — ATORVASTATIN CALCIUM 20 MG PO TABS: 80.0000 mg | ORAL_TABLET | Freq: Every day | ORAL | Status: DC

## 2018-02-27 MED ORDER — ASPIRIN 81 MG PO CHEW
81.0000 mg | CHEWABLE_TABLET | Freq: Every day | ORAL | Status: DC
  Filled 2018-02-27: qty 1

## 2018-02-27 MED ORDER — SODIUM CHLORIDE 3 % IV SOLN
INTRAVENOUS | Status: DC
  Administered 2018-02-27: 50 mL/h via INTRAVENOUS
  Filled 2018-02-27 (×3): qty 500

## 2018-02-27 NOTE — Progress Notes (Signed)
Dr. Lyndel Safe of [facility] has evaluated pt, and has spoken with Neuro interventional Radiology at [facility].  Dr. Alva Garnet has also discussed pt with Neuro interventional Radiology at [facility].  Per Dr. Lyndel Safe and Dr. Alva Garnet, there is no urgent indication for interventional procedure or for transfer to another facility at this time.  Will obtain repeat MRI tomorrow morning.  Goal SBP per Dr. Alva Garnet is b/w 120-140.  Levophed and Nicardipine infusions ordered as needed to maintain goal pressures.  Updated pt's wife of plan of care for tonight.   Darel Hong, AGACNP-BC [facility] Pager: [phone] Cell: [phone]

## 2018-02-27 NOTE — Progress Notes (Signed)
*  PRELIMINARY RESULTS* Echocardiogram 2D Echocardiogram has been performed.  Jeffery Grant 02/27/2018, 1:45 PM

## 2018-02-27 NOTE — Progress Notes (Signed)
Initial Nutrition Assessment  DOCUMENTATION CODES:   Severe malnutrition in context of chronic illness  INTERVENTION:   If tube feeds initiated, recommend Vital 1.2 @40ml /hr + Prostat 77ml daily   Propofol: 9.54ml/hr- provides 250kcal/day   Free water flushes 30ml q 4 hours for tube patency   Regimen provides 1502kcal/day, 87g/day protein, 998ml/day free water  Recommend liquid MVI daily via tube    NUTRITION DIAGNOSIS:   Severe Malnutrition related to chronic illness(multiple strokes, brain tumor 2011) as evidenced by moderate to severe fat depletions, moderate to severe muscle depletions.  GOAL:   Provide needs based on ASPEN/SCCM guidelines  MONITOR:   Vent status, Labs, Weight trends, Skin, I & O's  REASON FOR ASSESSMENT:   Ventilator    ASSESSMENT:   58 yo male with a PMH of Migraine, Kidney Stones, and Benign Paroxysmal Positional Vertigo, significant history of cerebellar tumor diagnosed in 2011 s/p craniotomy for brain tumor resection on 37/09/5883 without complications. Pt with vision changes 02/26/2018 and MRI which revealed acute/subacute hemorrhagic infarct right occipital parietal lobe and a small area of recent infarct in the right frontal lobe and possible cerebral emboli. Pt admitted to [facility] ER on 02/26/2018 for further stroke workup.   Pt sedated and ventilated. Family member at bedside reports pt with good appetite and oral intake pta; states "he was eating everything in sight up until this happened". Pt does not drink supplements at home. Pt's UBW is around 125lbs. It appears pt has lost ~10lbs(8%) from his UBW; there is no recent history in chart to confirm UBW. Family member unsure of any recent weight loss. OGT in place. Plan is for CT head today. Family requesting transfer to [facility]. Tube feed recommendations above.    Medications reviewed and include: aspirin, insulin, unasyn, pepcid, levophed, propofol   Labs reviewed: wbc- 12.7(H)  Patient is  currently intubated on ventilator support MV: 7.1 L/min Temp (24hrs), Avg:98.6 F (37 C), Min:97.3 F (36.3 C), Max:99.3 F (37.4 C)  Propofol: 9.37ml/hr- provides 250kcal/day   MAP- >75mmHg  UOP- 345ml   NUTRITION - FOCUSED PHYSICAL EXAM:    Most Recent Value  Orbital Region  Mild depletion  Upper Arm Region  Severe depletion  Thoracic and Lumbar Region  Moderate depletion  Buccal Region  Mild depletion  Temple Region  Mild depletion  Clavicle Bone Region  Moderate depletion  Clavicle and Acromion Bone Region  Moderate depletion  Scapular Bone Region  Moderate depletion  Dorsal Hand  Moderate depletion  Patellar Region  Severe depletion  Anterior Thigh Region  Severe depletion  Posterior Calf Region  Severe depletion  Edema (RD Assessment)  None  Hair  Reviewed  Eyes  Reviewed  Mouth  Reviewed  Skin  Reviewed  Nails  Reviewed     Diet Order:   Diet Order            Diet NPO time specified  Diet effective now             EDUCATION NEEDS:   Not appropriate for education at this time  Skin:  Skin Assessment: Reviewed RN Assessment  Last BM:  pta  Height:   Ht Readings from Last 1 Encounters:  02/26/18 5\' 11"  (1.803 m)    Weight:   Wt Readings from Last 1 Encounters:  02/26/18 52.5 kg    Ideal Body Weight:  78.18 kg  BMI:  Body mass index is 16.14 kg/m.  Estimated Nutritional Needs:   Kcal:  1384kcal/day   Protein:  79-89g/dday   Fluid:  1.5L/day   Koleen Distance MS, RD, LDN Pager #- [phone] Office#- [phone] After Hours Pager: [phone]

## 2018-02-27 NOTE — Progress Notes (Addendum)
Pharmacy Antibiotic Note  Jeffery Grant is a 58 y.o. male admitted on 02/26/2018 with aspiration pneumonia.  Pharmacy has been consulted for Unasyn dosing.  Plan: Continue Unasyn 3g IV q8h  Height: 5\' 11"  (180.3 cm) Weight: 115 lb 11.9 oz (52.5 kg) IBW/kg (Calculated) : 75.3  Temp (24hrs), Avg:98.6 F (37 C), Min:97.3 F (36.3 C), Max:99.3 F (37.4 C)  Recent Labs  Lab 02/24/18 0914 02/26/18 1139 02/27/18 0500  WBC 7.7 10.0 12.7*  CREATININE 1.05 0.83 0.92    Estimated Creatinine Clearance: 67.4 mL/min (by C-G formula based on SCr of 0.92 mg/dL).    Allergies  Allergen Reactions  . Penicillins     shot form    Thank you for allowing pharmacy to be a part of this patient's care.  Pernell Dupre, PharmD, BCPS Clinical Pharmacist 02/27/2018 1:29 PM
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
## 2018-02-27 NOTE — Progress Notes (Signed)
Pt currently on low dose Levophed, and with multiple IV infusions now that pt is intubated and sedated.  Called and discussed with pt's wife regarding placing a central line for IV access.  She would like to hold off on placing a central line for now and discuss with family.  Will hold off on placement of central line for now.    Darel Hong, AGACNP-BC [facility] Pager: [phone] Cell: [phone]
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
## 2018-02-27 NOTE — Progress Notes (Signed)
CRITICAL CARE NOTE  CC  CVA follow up respiratory failure  SUBJECTIVE Patient remains critically ill Prognosis is guarded Intubated on minimal sedation but poorly responsive     SIGNIFICANT EVENTS    BP 115/73   Pulse 70   Temp 98.4 F (36.9 C)   Resp 15   Ht '5\' 11"'  (1.803 m)   Wt 52.5 kg   SpO2 100%   BMI 16.14 kg/m    REVIEW OF SYSTEMS  PATIENT IS UNABLE TO PROVIDE COMPLETE REVIEW OF SYSTEMS DUE TO SEVERE CRITICAL ILLNESS   PHYSICAL EXAMINATION:  GENERAL:critically ill appearing, No resp distress HEAD: Normocephalic, atraumatic.  EYES: Pupils equal, round, reactive to light.  No scleral icterus.  MOUTH: Moist mucosal membrane. NECK: Supple. No thyromegaly. No nodules. No JVD.  PULMONARY:CTA CARDIOVASCULAR: S1 and S2. Regular rate and rhythm. No murmurs, rubs, or gallops.  GASTROINTESTINAL: Soft, nontender, -distended. No masses. Positive bowel sounds. No hepatosplenomegaly.  MUSCULOSKELETAL: No swelling, clubbing, or edema.  NEUROLOGIC:intubated off sedation opens eyes and follows simple commands. Left neglect  No movement of LLE and LUE. Moves RUE and RLE Pupillary and corneal reflexes intact. Plantars upgoing on left and downgoing on Right Left ptosis Pl see neuro note for full neuro exam SKIN:intact,warm,dry  INTAKE/OUTPUT  Intake/Output Summary (Last 24 hours) at 02/27/2018 1546 Last data filed at 02/27/2018 1240 Gross per 24 hour  Intake 646.72 ml  Output 540 ml  Net 106.72 ml    LABS  CBC Recent Labs  Lab 02/24/18 0914 02/26/18 1139 02/27/18 0500  WBC 7.7 10.0 12.7*  HGB 15.3 15.9 14.8  HCT 45.3 48.6 45.7  PLT 221 211 212   Coag's Recent Labs  Lab 02/26/18 1139 02/27/18 0500  APTT 32  --   INR 0.93 0.97   BMET Recent Labs  Lab 02/24/18 0914 02/26/18 1139 02/27/18 0500  NA 140 140 139  K 4.4 3.8 3.6  CL 102 106 107  CO2 '23 28 24  ' BUN '12 12 15  ' CREATININE 1.05 0.83 0.92  GLUCOSE 87 92 97   Electrolytes Recent  Labs  Lab 02/24/18 0914 02/26/18 1139 02/27/18 0500  CALCIUM 9.5 9.3 8.7*   Sepsis Markers Recent Labs  Lab 02/27/18 0026 02/27/18 0500  PROCALCITON <0.10 <0.10   ABG Recent Labs  Lab 02/26/18 2057 02/27/18 0348  PHART 7.46* 7.42  PCO2ART 38 39  PO2ART 102 135*   Liver Enzymes Recent Labs  Lab 02/24/18 0914 02/26/18 1139 02/27/18 0500  AST '21 22 20  ' ALT '13 16 12  ' ALKPHOS 66 66 61  BILITOT 0.4 0.8 0.6  ALBUMIN 4.7 4.8 4.0   Cardiac Enzymes Recent Labs  Lab 02/26/18 1139  TROPONINI <0.03   Glucose Recent Labs  Lab 02/26/18 2142 02/26/18 2357 02/27/18 0355 02/27/18 0723 02/27/18 0830 02/27/18 1212  GLUCAP 99 91 95 89 90 82     Recent Results (from the past 240 hour(s))  MRSA PCR Screening     Status: None   Collection Time: 02/26/18  5:25 PM  Result Value Ref Range Status   MRSA by PCR NEGATIVE NEGATIVE Final    Comment:        The GeneXpert MRSA Assay (FDA approved for NASAL specimens only), is one component of a comprehensive MRSA colonization surveillance program. It is not intended to diagnose MRSA infection nor to guide or monitor treatment for MRSA infections. Performed at [facility], [address] [place], [place] [address]   Culture, respiratory (non-expectorated)  Status: None (Preliminary result)   Collection Time: 02/26/18 11:51 PM  Result Value Ref Range Status   Specimen Description   Final    TRACHEAL ASPIRATE Performed at [facility], [address], [place], [place] [address]    Special Requests   Final    Normal Performed at [facility], [place], [place] [address]    Gram Stain   Final    RARE WBC PRESENT, PREDOMINANTLY MONONUCLEAR MODERATE GRAM POSITIVE COCCI IN PAIRS Performed at [facility], [place] [address] [place], [place] [address]    Culture PENDING  Incomplete   Report Status PENDING  Incomplete    MEDICATIONS   Current Facility-Administered  Medications:  .  Ampicillin-Sulbactam (UNASYN) 3 g in sodium chloride 0.9 % 100 mL IVPB, 3 g, Intravenous, Q8H, Bettey Costa, MD, Stopped at 02/27/18 0821 .  aspirin chewable tablet 81 mg, 81 mg, Per Tube, Daily, Arnaldo Natal, MD, 81 mg at 02/27/18 1249 .  atorvastatin (LIPITOR) tablet 80 mg, 80 mg, Per Tube, q1800, Arnaldo Natal, MD .  chlorhexidine gluconate (MEDLINE KIT) (PERIDEX) 0.12 % solution 15 mL, 15 mL, Mouth Rinse, BID, Darel Hong D, NP, 15 mL at 02/27/18 0752 .  famotidine (PEPCID) IVPB 20 mg premix, 20 mg, Intravenous, Q24H, Awilda Bill, NP, Stopped at 02/26/18 2128 .  fentaNYL (SUBLIMAZE) injection 100 mcg, 100 mcg, Intravenous, Q15 min PRN, Awilda Bill, NP .  fentaNYL (SUBLIMAZE) injection 100 mcg, 100 mcg, Intravenous, Q2H PRN, Awilda Bill, NP, 100 mcg at 02/26/18 2011 .  hydrALAZINE (APRESOLINE) injection 10-20 mg, 10-20 mg, Intravenous, Q4H PRN, Awilda Bill, NP, 20 mg at 02/26/18 1937 .  insulin aspart (novoLOG) injection 0-15 Units, 0-15 Units, Subcutaneous, Q4H, Darel Hong D, NP .  ipratropium-albuterol (DUONEB) 0.5-2.5 (3) MG/3ML nebulizer solution 3 mL, 3 mL, Nebulization, Q4H PRN, Darel Hong D, NP .  LORazepam (ATIVAN) injection 2 mg, 2 mg, Intravenous, Q4H PRN, Darel Hong D, NP .  MEDLINE mouth rinse, 15 mL, Mouth Rinse, 10 times per day, Darel Hong D, NP, 15 mL at 02/27/18 1253 .  nicardipine (CARDENE) 57m in 0.86% saline 20670mIV infusion (0.1 mg/ml), 3-15 mg/hr, Intravenous, Continuous, KeDarel Hong, NP, Last Rate: 30 mL/hr at 02/27/18 1240, 3 mg/hr at 02/27/18 1240 .  norepinephrine (LEVOPHED) 70m65mn NS 250m50memix infusion, 0-40 mcg/min, Intravenous, Titrated, BlakAwilda Bill, Last Rate: 15 mL/hr at 02/27/18 0854, 4 mcg/min at 02/27/18 0854 .  ondansetron (ZOFRAN) tablet 4 mg, 4 mg, Oral, Q6H PRN **OR** ondansetron (ZOFRAN) injection 4 mg, 4 mg, Intravenous, Q6H PRN, Gouru, Aruna, MD .  propofol (DIPRIVAN) 1000  MG/100ML infusion, 0-50 mcg/kg/min, Intravenous, Continuous, Blakeney, DanaDreama Saa, Last Rate: 9.45 mL/hr at 02/27/18 0854, 30 mcg/kg/min at 02/27/18 0854     CT head done today as per rad  COMPARISON:  CT 02/26/2018, MRI 02/26/2018  FINDINGS: Brain: Interval development of a large RIGHT MCA infarction involving the inferior and superior RIGHT frontal lobe, temporal lobe and parietal lobe (roughly 9.5 x 4.3 x 5.2 cm). This new large infarction is less dense than the previous described right parietal infarction. No evidence hemorrhage within the infarction. Basal cisterns are patent. No midline shift.  Vascular: No intracranial hemorrhage. No distinct hyper dense vessels.  Skull: Normal. Negative for fracture or focal lesion.  Sinuses/Orbits: No acute finding.  Other: None  IMPRESSION: 1. Interval development of large RIGHT MCA territory infarction. 2. Again demonstrated subacute RIGHT  parietal infarction. 3. No midline shift or significant mass effect. Basal cisterns are patent.   ASSESSMENT AND PLAN SYNOPSIS  58 y.o.malewith a history significant for cerebellar tumor [place] S/P craniotomy  in 2011 presented with subacute R Occipital parietal stroke with hemorrhagic conversion then became bradycardic unresponsive  And got intubated. He cont to be on ventilator. CT 02/27/18 shows new R MCA territory infarction No ASA as per neurology  Avoid sedation. He is off sedation at present a -Avoid hypotension, keep SBP<160 at this point given history of IVH and hemorrhagic conversion  -stopped Nimodipine  -Cardiac work up for bradycardia and hypotensive as needed -  ECHO is pending   -Lipitor 80 mg QHS --PT/OT when able -Plan discussed in details with the patient family at bedside, and with the neurology and as per family request he is being transferred to [facility].  Cont full vent support ? aspiration on unasyn  Prognosis poor. Critical Care Time devoted to  patient care services described in this note is 35 minutes.    Rosine Door, MD  02/27/2018 3:46 PM [facility]

## 2018-02-27 NOTE — Consult Note (Signed)
PHARMACY CONSULT NOTE - INITIAL   Pharmacy Consult for 3% sodium chloride continuous infusion Monitoring  Indication: R. Occipital parietal stroke with hemorrhagic conversion   Vital Signs: Temp: 98.4 F (36.9 C) (01/17 1400) Temp Source: Esophageal (01/17 0805) BP: 128/74 (01/17 1400) Pulse Rate: 78 (01/17 1400) Intake/Output from previous day: 01/16 0701 - 01/17 0700 In: 498.7 [I.V.:348.6; IV Piggyback:150.1] Out: 380 [Urine:380] Intake/Output from this shift: Total I/O In: 148 [I.V.:48; IV Piggyback:100] Out: 160 [Urine:160]  Labs: Recent Labs    02/26/18 1139 02/27/18 0500  WBC 10.0 12.7*  HGB 15.9 14.8  HCT 48.6 45.7  PLT 211 212  APTT 32  --   CREATININE 0.83 0.92  ALBUMIN 4.8 4.0  PROT 7.9 6.8  AST 22 20  ALT 16 12  ALKPHOS 66 61  BILITOT 0.8 0.6   Estimated Creatinine Clearance: 67.4 mL/min (by C-G formula based on SCr of 0.92 mg/dL).   Microbiology: Recent Results (from the past 720 hour(s))  MRSA PCR Screening     Status: None   Collection Time: 02/26/18  5:25 PM  Result Value Ref Range Status   MRSA by PCR NEGATIVE NEGATIVE Final    Comment:        The GeneXpert MRSA Assay (FDA approved for NASAL specimens only), is one component of a comprehensive MRSA colonization surveillance program. It is not intended to diagnose MRSA infection nor to guide or monitor treatment for MRSA infections. Performed at [facility], [address] [place], [place] [address]   Culture, respiratory (non-expectorated)     Status: None (Preliminary result)   Collection Time: 02/26/18 11:51 PM  Result Value Ref Range Status   Specimen Description   Final    TRACHEAL ASPIRATE Performed at [facility], [address], [place], [place] [address]    Special Requests   Final    Normal Performed at [facility], [place], [place] [address]    Gram Stain   Final    RARE WBC PRESENT, PREDOMINANTLY MONONUCLEAR MODERATE  GRAM POSITIVE COCCI IN PAIRS Performed at [facility], [place] [address], [place], [place] [address]    Culture PENDING  Incomplete   Report Status PENDING  Incomplete    Medical History: Past Medical History:  Diagnosis Date  . Benign paroxysmal positional vertigo 03/27/2009  . History of chicken pox   . Kidney stone   . Migraine     Assessment: Pharmacy consulted for  3% sodium chloride continuous infusion Monitoring and follow up as needed with provider with Na levels.   1/17 @ 0500 Na: 139  Goal of Therapy:  Na Level:150-131mEq/L  Plan:  Contact provider if Na rises >38mEq over 2 hours or >6 mEq/L over 4 hours.   Notify provider if Na level is <121mEq/L or >160 mEq/L  If sodium level >160 3% saline should be STOPPED  If infusion continued >24 hours a central line should be placed.    Pernell Dupre, PharmD, BCPS Clinical Pharmacist 02/27/2018 3:13 PM

## 2018-02-28 LAB — ANTIPHOSPHOLIPID SYNDROME PROF
ANTICARDIOLIPIN IGM: 9 [MPL'U]/mL (ref 0–12)
Anticardiolipin IgG: <9 GPL U/mL (ref 0–14)
DRVVT: 32.3 s (ref 0.0–47.0)
PTT Lupus Anticoagulant: 33.1 s (ref 0.0–51.9)

## 2018-02-28 LAB — PROTEIN C ACTIVITY: Protein C Activity: 102 % (ref 73–180)

## 2018-02-28 LAB — PROTEIN S ACTIVITY: Protein S Activity: 82 % (ref 63–140)

## 2018-02-28 MED ORDER — HYDRALAZINE HCL 20 MG/ML IJ SOLN
10.00 | INTRAMUSCULAR | Status: DC
Start: ? — End: 2018-02-28

## 2018-02-28 MED ORDER — CHLORHEXIDINE GLUCONATE 0.12 % MT SOLN
5.00 | OROMUCOSAL | Status: DC
Start: 2018-03-01 — End: 2018-02-28

## 2018-02-28 MED ORDER — GLUCAGON HCL RDNA (DIAGNOSTIC) 1 MG IJ SOLR
1.00 | INTRAMUSCULAR | Status: DC
Start: ? — End: 2018-02-28

## 2018-02-28 MED ORDER — ATORVASTATIN CALCIUM 10 MG PO TABS
20.00 | ORAL_TABLET | ORAL | Status: DC
Start: 2018-03-01 — End: 2018-02-28

## 2018-02-28 MED ORDER — LABETALOL HCL 5 MG/ML IV SOLN
10.00 | INTRAVENOUS | Status: DC
Start: ? — End: 2018-02-28

## 2018-02-28 MED ORDER — GENERIC EXTERNAL MEDICATION
Status: DC
Start: ? — End: 2018-02-28

## 2018-02-28 MED ORDER — LIDOCAINE HCL 1 % IJ SOLN
.50 | INTRAMUSCULAR | Status: DC
Start: ? — End: 2018-02-28

## 2018-02-28 MED ORDER — FENTANYL CITRATE (PF) 50 MCG/ML IJ SOLN
12.50 | INTRAMUSCULAR | Status: DC
Start: ? — End: 2018-02-28

## 2018-02-28 MED ORDER — ACETAMINOPHEN 325 MG PO TABS
650.00 | ORAL_TABLET | ORAL | Status: DC
Start: ? — End: 2018-02-28

## 2018-02-28 MED ORDER — SODIUM CHLORIDE 0.9 % IV SOLN
INTRAVENOUS | Status: DC
Start: ? — End: 2018-02-28

## 2018-02-28 MED ORDER — RANITIDINE HCL 150 MG PO TABS
150.00 | ORAL_TABLET | ORAL | Status: DC
Start: 2018-03-01 — End: 2018-02-28

## 2018-02-28 MED ORDER — POLYETHYLENE GLYCOL 3350 17 G PO PACK
17.00 | PACK | ORAL | Status: DC
Start: 2018-03-24 — End: 2018-02-28

## 2018-02-28 MED ORDER — DEXTROSE-NACL 5-0.9 % IV SOLN
100.00 | INTRAVENOUS | Status: DC
Start: ? — End: 2018-02-28

## 2018-02-28 MED ORDER — LIDOCAINE HCL 1 % IJ SOLN
3.00 | INTRAMUSCULAR | Status: DC
Start: ? — End: 2018-02-28

## 2018-02-28 MED ORDER — INSULIN REGULAR HUMAN 100 UNIT/ML IJ SOLN
.00 | INTRAMUSCULAR | Status: DC
Start: 2018-03-01 — End: 2018-02-28

## 2018-02-28 MED ORDER — SENNOSIDES-DOCUSATE SODIUM 8.6-50 MG PO TABS
2.00 | ORAL_TABLET | ORAL | Status: DC
Start: 2018-03-23 — End: 2018-02-28

## 2018-02-28 MED ORDER — DEXTROSE 50 % IV SOLN
12.50 | INTRAVENOUS | Status: DC
Start: ? — End: 2018-02-28

## 2018-03-01 LAB — CULTURE, RESPIRATORY W GRAM STAIN
Culture: NORMAL
Special Requests: NORMAL

## 2018-03-01 MED ORDER — AMLODIPINE BESYLATE 10 MG PO TABS
10.00 | ORAL_TABLET | ORAL | Status: DC
Start: 2018-03-07 — End: 2018-03-01

## 2018-03-01 MED ORDER — LABETALOL HCL 5 MG/ML IV SOLN
10.00 | INTRAVENOUS | Status: DC
Start: ? — End: 2018-03-01

## 2018-03-01 MED ORDER — ATORVASTATIN CALCIUM 40 MG PO TABS
40.00 | ORAL_TABLET | ORAL | Status: DC
Start: 2018-03-07 — End: 2018-03-01

## 2018-03-01 MED ORDER — HYDRALAZINE HCL 20 MG/ML IJ SOLN
10.00 | INTRAMUSCULAR | Status: DC
Start: ? — End: 2018-03-01

## 2018-03-01 MED ORDER — ASPIRIN 81 MG PO CHEW
81.00 | CHEWABLE_TABLET | ORAL | Status: DC
Start: 2018-03-24 — End: 2018-03-01

## 2018-03-01 MED ORDER — HEPARIN SODIUM (PORCINE) 5000 UNIT/ML IJ SOLN
5000.00 | INTRAMUSCULAR | Status: DC
Start: 2018-03-01 — End: 2018-03-01

## 2018-03-02 LAB — GLUCOSE, CAPILLARY: Glucose-Capillary: 77 mg/dL (ref 70–99)

## 2018-03-03 LAB — FACTOR 5 LEIDEN

## 2018-03-03 MED ORDER — ENOXAPARIN SODIUM 40 MG/0.4ML ~~LOC~~ SOLN
40.00 | SUBCUTANEOUS | Status: DC
Start: 2018-03-24 — End: 2018-03-03

## 2018-03-03 MED ORDER — BISACODYL 10 MG RE SUPP
10.00 | RECTAL | Status: DC
Start: ? — End: 2018-03-03

## 2018-03-06 MED ORDER — HYDRALAZINE HCL 20 MG/ML IJ SOLN
10.00 | INTRAMUSCULAR | Status: DC
Start: ? — End: 2018-03-06

## 2018-03-06 MED ORDER — GENERIC EXTERNAL MEDICATION
1.00 | Status: DC
Start: 2018-03-06 — End: 2018-03-06

## 2018-03-06 MED ORDER — MELATONIN 3 MG PO TABS
3.00 | ORAL_TABLET | ORAL | Status: DC
Start: 2018-03-23 — End: 2018-03-06

## 2018-03-06 MED ORDER — LABETALOL HCL 5 MG/ML IV SOLN
10.00 | INTRAVENOUS | Status: DC
Start: ? — End: 2018-03-06

## 2018-03-06 MED ORDER — GENERIC EXTERNAL MEDICATION
Status: DC
Start: ? — End: 2018-03-06

## 2018-03-06 MED ORDER — GENERIC EXTERNAL MEDICATION
1.25 | Status: DC
Start: 2018-03-06 — End: 2018-03-06

## 2018-03-10 DIAGNOSIS — I69391 Dysphagia following cerebral infarction: Secondary | ICD-10-CM | POA: Insufficient documentation

## 2018-03-23 MED ORDER — GENERIC EXTERNAL MEDICATION
1.00 | Status: DC
Start: 2018-03-23 — End: 2018-03-23

## 2018-03-23 MED ORDER — LIDOCAINE 5 % EX PTCH
1.00 | MEDICATED_PATCH | CUTANEOUS | Status: DC
Start: 2018-03-23 — End: 2018-03-23

## 2018-03-23 MED ORDER — ATORVASTATIN CALCIUM 10 MG PO TABS
20.00 | ORAL_TABLET | ORAL | Status: DC
Start: 2018-03-24 — End: 2018-03-23
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
## 2018-04-30 ENCOUNTER — Telehealth: Payer: Self-pay | Admitting: Family Medicine

## 2018-04-30 NOTE — Telephone Encounter (Signed)
I called [facility] nurse line [phone] (option 4) and spoke with a nurse advising them that patient is trying contact then for the urgent referral. I gave the nurse an updated contact number (which is the patients wife's phone). I  was advised that Cardiology would reach out to patient to schedule appointment. I called patient's wife Tammy and advised her of this. I also gave her the phone number to [facility] Nurse line as listed above.

## 2018-04-30 NOTE — Telephone Encounter (Signed)
I spoke with patient's wife Tammy (ok per DPR) and advised her as below. She states that they haven't heard from [facility] yet. Tammy says that patient still has the Holter monitor at home,  and they haven't returned it. I advised Tammy to contact Neurology in regards to the Urgent referral to Cardiac electrophysiology. Tammy verbalized understanding and agreed to call them back once she got home.

## 2018-04-30 NOTE — Telephone Encounter (Signed)
He has not been seen by me since hospitalization. OK for verbal orders, but if we sign off on his home health orders he will need to be seen within 30 days.

## 2018-04-30 NOTE — Telephone Encounter (Signed)
I called and spoke with patient advising him as below. Follow up appointment scheduled Monday 05/04/2018 at 3pm.

## 2018-04-30 NOTE — Telephone Encounter (Signed)
Sarah advised.  Left message advising pt to call and schedule a hospital follow up.  Please schedule when pt calls back.   Thanks,   -Mickel Baas

## 2018-04-30 NOTE — Telephone Encounter (Signed)
Please advise patient that [facility] is trying to get in touch with him regarding his heart monitor. he had episodes of brady cardia and ventricular tachycardia and needs referral to Cardiac electrophysiology ASAP. If he hasn't heard from [facility] then we need to place a referral order for him.

## 2018-04-30 NOTE — Telephone Encounter (Signed)
Pt's wife called number she was given.  She has not been able to reach anyone at [facility].  Please advise.  Thanks, [employer]

## 2018-04-30 NOTE — Telephone Encounter (Signed)
Sarah with [facility] called saying they did a start of care with patient and they need a verbal saying he needs a nurse to see him once a week for 5 weeks.  Needs order for care and education of his G tube . Please approve 200 ml flushes twice a day.  He is taking food and medication by mouth.  Does patient need to come in and see Dr. Caryn Section?  CAll back is [phone]  Thanks Con Memos

## 2018-05-04 ENCOUNTER — Other Ambulatory Visit: Payer: Self-pay

## 2018-05-04 ENCOUNTER — Ambulatory Visit (INDEPENDENT_AMBULATORY_CARE_PROVIDER_SITE_OTHER): Payer: PRIVATE HEALTH INSURANCE | Admitting: Family Medicine

## 2018-05-04 ENCOUNTER — Encounter: Payer: Self-pay | Admitting: Family Medicine

## 2018-05-04 VITALS — BP 156/81 | HR 52 | Temp 97.9°F | Resp 16 | Wt 118.0 lb

## 2018-05-04 DIAGNOSIS — I472 Ventricular tachycardia, unspecified: Secondary | ICD-10-CM

## 2018-05-04 DIAGNOSIS — I639 Cerebral infarction, unspecified: Secondary | ICD-10-CM | POA: Diagnosis not present

## 2018-05-04 NOTE — Patient Instructions (Signed)
.   Please review the attached list of medications and notify my office if there are any errors.   . Please bring all of your medications to every appointment so we can make sure that our medication list is the same as yours.   . Please go to the lab draw station in [address] on the second floor of [facility]. Normal hours are 8:00am to 12:30pm and 1:30pm to 4:00pm Monday through Friday

## 2018-05-04 NOTE — Progress Notes (Signed)
Patient: Jeffery Grant Male    DOB: [birth year]   58 y.o.   MRN: [id number] Visit Date: 05/04/2018  Today's Provider: Lelon Huh, MD   Chief Complaint  Patient presents with  . Hospitalization Follow-up   Subjective:     HPI     Follow up Hospitalization  Patient was admitted to [facility] on 02/27/2018 and discharged on 03/27/2018.  Pt was discharged to [facility] until 04/27/2018 He was treated for right MCA CVA causing dysphagia and speech difficulty. . Treatment for this included Physical therapy, Speech therapy, OT therapy.  He reports excellent compliance with treatment. He reports this condition is Improved. He had g-tube placed in the hospital but now reports he is eating well and not used the g-tube for a few weeks. He still has some difficulty with speech.   He did have event monitor ordered by neurology earlier this month which revealed episodes of bradycardia and short episode of v-tach. Neurologist ahs been attempting to contact patient to schedule appointment with cardiac EP.  ------------------------------------------------------------------------------------     Allergies  Allergen Reactions  . Penicillins     shot form     Current Outpatient Medications:  .  aspirin 81 MG chewable tablet, by Nasogastric route., Disp: , Rfl:  .  atorvastatin (LIPITOR) 20 MG tablet, Take 1 tablet (20 mg total) by G tube once daily, Disp: , Rfl:  .  clopidogrel (PLAVIX) 75 MG tablet, Take 1 tablet (75 mg total) by G tube once daily, Disp: , Rfl:  .  lidocaine (LIDODERM) 5 %, PLACE 1 PATCH ON THE MOST PAINFUL AREA TOPICALLY ONCE A DAY AND REMOVE OLD PATCH PER SCHEDULE, Disp: , Rfl:  .  Melatonin 3 MG TABS, 1 tablet (3 mg total) by Via Tube route nightly, Disp: , Rfl:  .  senna-docusate (SENOKOT-S) 8.6-50 MG tablet, Take 2 tablets by G tube 2 (two) times daily, Disp: , Rfl:   Review of Systems  Constitutional: Negative.   HENT: Negative for sneezing.    Respiratory: Negative.   Gastrointestinal: Positive for constipation. Negative for abdominal distention, abdominal pain, anal bleeding, blood in stool, diarrhea, nausea, rectal pain and vomiting.  Musculoskeletal: Positive for arthralgias (Left arm/hand). Negative for back pain, gait problem, joint swelling, myalgias, neck pain and neck stiffness.  Neurological: Positive for weakness (On the left side. ). Negative for dizziness, light-headedness and headaches.    Social History   Tobacco Use  . Smoking status: Former Smoker    Packs/day: 1.00    Years: 30.00    Pack years: 30.00    Types: Cigarettes  . Smokeless tobacco: Never Used  Substance Use Topics  . Alcohol use: Yes    Alcohol/week: 0.0 standard drinks    Comment: occasional use      Objective:   BP (!) 156/81 (BP Location: Right Arm, Patient Position: Sitting, Cuff Size: Normal)   Pulse (!) 52   Temp 97.9 F (36.6 C) (Oral)   Resp 16   Wt 118 lb (53.5 kg)   BMI 16.46 kg/m  Vitals:   05/04/18 1525  BP: (!) 156/81  Pulse: (!) 52  Resp: 16  Temp: 97.9 F (36.6 C)  TempSrc: Oral  Weight: 118 lb (53.5 kg)     Physical Exam   General Appearance:    Alert, cooperative, no distress  Eyes:    PERRL, conjunctiva/corneas clear, EOM's intact       Lungs:     Clear  to auscultation bilaterally, respirations unlabored  Heart:    Regular rate and rhythm  Neurologic:   Awake, alert, oriented x 3. Some difficulty with speech, but is clear and coherent.       .     Assessment & Plan    1. Ventricular tachycardia (HCC) (brief episode on event monitor) Advised patient we gave [facility] updated contact information and to expect call to schedule follow up with EP.  - Comprehensive metabolic panel - Lipid panel - Magnesium - TSH - CBC  2. Cerebrovascular accident (CVA), unspecified mechanism ([place]) Continues to have mild speech impairment, but is eating well with no other significant neurologic symptoms.  - Comprehensive  metabolic panel - Lipid panel - Magnesium - TSH - CBC     Lelon Huh, MD  [facility]

## 2018-05-05 ENCOUNTER — Telehealth: Payer: Self-pay

## 2018-05-05 LAB — COMPREHENSIVE METABOLIC PANEL
ALT: 18 IU/L (ref 0–44)
AST: 23 IU/L (ref 0–40)
Albumin/Globulin Ratio: 2 (ref 1.2–2.2)
Albumin: 4.3 g/dL (ref 3.8–4.9)
Alkaline Phosphatase: 62 IU/L (ref 39–117)
BUN/Creatinine Ratio: 13 (ref 9–20)
BUN: 11 mg/dL (ref 6–24)
Bilirubin Total: <0.2 mg/dL (ref 0.0–1.2)
CALCIUM: 9.3 mg/dL (ref 8.7–10.2)
CO2: 26 mmol/L (ref 20–29)
Chloride: 101 mmol/L (ref 96–106)
Creatinine, Ser: 0.85 mg/dL (ref 0.76–1.27)
GFR calc Af Amer: 113 mL/min/{1.73_m2} (ref >59)
GFR calc non Af Amer: 98 mL/min/{1.73_m2} (ref >59)
Globulin, Total: 2.2 g/dL (ref 1.5–4.5)
Glucose: 72 mg/dL (ref 65–99)
Potassium: 4.2 mmol/L (ref 3.5–5.2)
Sodium: 142 mmol/L (ref 134–144)
Total Protein: 6.5 g/dL (ref 6.0–8.5)

## 2018-05-05 LAB — LIPID PANEL
Chol/HDL Ratio: 2 ratio (ref 0.0–5.0)
Cholesterol, Total: 135 mg/dL (ref 100–199)
HDL: 69 mg/dL (ref >39)
LDL CALC: 48 mg/dL (ref 0–99)
Triglycerides: 92 mg/dL (ref 0–149)
VLDL Cholesterol Cal: 18 mg/dL (ref 5–40)

## 2018-05-05 LAB — CBC
Hematocrit: 40.1 % (ref 37.5–51.0)
Hemoglobin: 13.2 g/dL (ref 13.0–17.7)
MCH: 30.6 pg (ref 26.6–33.0)
MCHC: 32.9 g/dL (ref 31.5–35.7)
MCV: 93 fL (ref 79–97)
Platelets: 250 10*3/uL (ref 150–450)
RBC: 4.32 x10E6/uL (ref 4.14–5.80)
RDW: 14.2 % (ref 11.6–15.4)
WBC: 7.7 10*3/uL (ref 3.4–10.8)

## 2018-05-05 LAB — TSH: TSH: 1.92 u[IU]/mL (ref 0.450–4.500)

## 2018-05-05 LAB — MAGNESIUM: Magnesium: 2.1 mg/dL (ref 1.6–2.3)

## 2018-05-05 MED ORDER — AMLODIPINE BESYLATE 5 MG PO TABS: 5.0000 mg | ORAL_TABLET | Freq: Every day | ORAL | 2 refills | Status: DC

## 2018-05-05 NOTE — Telephone Encounter (Signed)
Start amlodipine 5mg  daily, have sent [facility].

## 2018-05-05 NOTE — Telephone Encounter (Signed)
LMTCB 05/05/2018   Thanks,   -Mickel Baas

## 2018-05-05 NOTE — Telephone Encounter (Signed)
Mrs. Fendley advised.   Thanks,   -Mickel Baas

## 2018-05-05 NOTE — Telephone Encounter (Signed)
Pt advised.   Thanks,   -Marieta Markov  

## 2018-05-05 NOTE — Addendum Note (Signed)
Addended by: Birdie Sons on: 05/05/2018 02:31 PM   Modules accepted: Orders

## 2018-05-05 NOTE — Telephone Encounter (Signed)
Cinda Quest, RN from [facility] is calling saying that she visited the patient, and his BP is still elevated at 176/90. She reports that the patient is asymptomatic at this time. However, the patient's wife is concerned that his BP is that high due to his history of a CVA. Should patient's medications be adjusted? Please call Malinda back to let her know at [phone]. Thanks!

## 2018-05-05 NOTE — Telephone Encounter (Signed)
-----   Message from Birdie Sons, MD sent at 05/05/2018  1:11 PM EDT ----- All labs including blood cell count, liver functions, kidney functions, thyroid functions, electrolytes are all good. Continue current medications.

## 2018-05-06 ENCOUNTER — Telehealth: Payer: Self-pay

## 2018-05-06 NOTE — Telephone Encounter (Signed)
That's fine

## 2018-05-06 NOTE — Telephone Encounter (Signed)
Jeffery Grant with [facility] needs verbal orders for OT  Two week two visit One week one visit   Contact Number [phone]  Thanks,   -Mickel Baas

## 2018-05-06 NOTE — Telephone Encounter (Signed)
Verbal orders given to [facility] with [facility].

## 2018-05-07 ENCOUNTER — Encounter: Payer: Self-pay | Admitting: Family Medicine

## 2018-05-08 ENCOUNTER — Telehealth: Payer: Self-pay

## 2018-05-08 NOTE — Telephone Encounter (Signed)
Amy from [facility] had called requesting verbal orders for home speech therapy x1 for one week and x3 for two weeks.

## 2018-05-11 DIAGNOSIS — Z431 Encounter for attention to gastrostomy: Secondary | ICD-10-CM | POA: Diagnosis not present

## 2018-05-11 DIAGNOSIS — I69391 Dysphagia following cerebral infarction: Secondary | ICD-10-CM

## 2018-05-11 DIAGNOSIS — E43 Unspecified severe protein-calorie malnutrition: Secondary | ICD-10-CM | POA: Diagnosis not present

## 2018-05-11 DIAGNOSIS — E119 Type 2 diabetes mellitus without complications: Secondary | ICD-10-CM

## 2018-05-11 DIAGNOSIS — R1312 Dysphagia, oropharyngeal phase: Secondary | ICD-10-CM | POA: Diagnosis not present

## 2018-05-11 NOTE — Telephone Encounter (Signed)
That's fine

## 2018-05-12 NOTE — Telephone Encounter (Signed)
Amy with [facility] advised

## 2018-05-15 ENCOUNTER — Telehealth: Payer: Self-pay

## 2018-05-15 NOTE — Telephone Encounter (Signed)
That's fine

## 2018-05-15 NOTE — Telephone Encounter (Signed)
Verbal order given. KW 

## 2018-05-15 NOTE — Telephone Encounter (Signed)
Amy with home health states pt missed his speech therapy today.   She would like verbal orders to make up today session.   [phone] Contact Amy    Thanks,   -Mickel Baas

## 2018-05-19 ENCOUNTER — Telehealth: Payer: Self-pay

## 2018-05-19 NOTE — Telephone Encounter (Signed)
Verbal orders given as below.

## 2018-05-19 NOTE — Telephone Encounter (Signed)
That's fine

## 2018-05-19 NOTE — Telephone Encounter (Signed)
Jeffery Grant OT w/ [facility] called and needs a verbal order for the patient. 1 week 1 2 week 1 Call back number is [phone]. Please advise.

## 2018-05-27 ENCOUNTER — Other Ambulatory Visit: Payer: Self-pay | Admitting: Family Medicine

## 2018-05-28 ENCOUNTER — Telehealth: Payer: Self-pay

## 2018-05-28 NOTE — Telephone Encounter (Signed)
That's fine

## 2018-05-28 NOTE — Telephone Encounter (Signed)
Verbal orders given  

## 2018-05-28 NOTE — Telephone Encounter (Signed)
Jeffery Grant with [facility] called requesting verbaly orders for continued speech therapy with the frequency:  1 week 1 2 week 2  Please call back at [phone]

## 2018-05-29 ENCOUNTER — Other Ambulatory Visit: Payer: Self-pay

## 2018-05-29 MED ORDER — CLOPIDOGREL BISULFATE 75 MG PO TABS: ORAL_TABLET | ORAL | 1 refills | Status: DC

## 2018-05-29 MED ORDER — ATORVASTATIN CALCIUM 20 MG PO TABS: ORAL_TABLET | ORAL | 1 refills | Status: DC

## 2018-06-16 ENCOUNTER — Telehealth: Payer: Self-pay

## 2018-06-16 NOTE — Telephone Encounter (Signed)
[facility]ery [facility] with [facility] calling for verbal orders to continue home speech Therapy 2x for one more week. CB# [phone]

## 2018-06-16 NOTE — Telephone Encounter (Signed)
That's fine

## 2018-06-17 NOTE — Telephone Encounter (Signed)
Spoke to Amy and she advised that when she went to pt's home for speech therapy on 06/16/18 pt wasn't at home so she had to cancel that visit.  Left message with his wife and she hasn't had a return call back yet.  dbs

## 2018-06-17 NOTE — Telephone Encounter (Signed)
Spoke to amy from [facility] and gave ok for orders needed for pt.  dbs

## 2018-06-25 ENCOUNTER — Telehealth: Payer: Self-pay

## 2018-06-25 NOTE — Telephone Encounter (Signed)
That's fine

## 2018-06-25 NOTE — Telephone Encounter (Signed)
Jeffery Grant @ [facility]. She needs verbal orders to rectify speech therapy 2 times a week for 3 more weeks. [phone].

## 2018-06-26 NOTE — Telephone Encounter (Signed)
Jeffery Grant [phone] w/ [facility] was advised of verbal order.

## 2018-07-16 ENCOUNTER — Telehealth: Payer: Self-pay | Admitting: Family Medicine

## 2018-07-16 NOTE — Telephone Encounter (Signed)
Verbal orders left of Amy's secure/ confidential voice message system.

## 2018-07-16 NOTE — Telephone Encounter (Signed)
Amy (Speech therapist) with [facility] called for verbal order to continue in home a speech therapy 2 times  A week for 3 weeks.   Thanks [employer]

## 2018-07-16 NOTE — Telephone Encounter (Signed)
That's fine

## 2018-07-27 ENCOUNTER — Telehealth: Payer: Self-pay

## 2018-07-27 NOTE — Telephone Encounter (Signed)
Jeffery Grant from [facility] called to report that she had to miss an appt due to a sick child. For speech therapy.

## 2018-08-05 ENCOUNTER — Telehealth: Payer: Self-pay

## 2018-08-05 NOTE — Telephone Encounter (Signed)
Need more info. Why are they requesting psychiatry referral

## 2018-08-05 NOTE — Telephone Encounter (Signed)
FYIRip Harbour with [facility] called to report patient will be D/C from speech therapy early due to therapist being out of the office. Patient denied transferring service to another therapist.   Patient's wife is requesting a psychiatry referral. Please advise.

## 2018-08-19 ENCOUNTER — Other Ambulatory Visit: Payer: Self-pay | Admitting: Family Medicine

## 2018-11-22 ENCOUNTER — Other Ambulatory Visit: Payer: Self-pay | Admitting: Family Medicine

## 2019-03-17 ENCOUNTER — Emergency Department: Payer: HRSA Program

## 2019-03-17 ENCOUNTER — Emergency Department
Admission: EM | Admit: 2019-03-17 | Discharge: 2019-03-17 | Disposition: A | Discharge status: Home / Self Care | Payer: HRSA Program | Attending: Emergency Medicine | Admitting: Emergency Medicine

## 2019-03-17 ENCOUNTER — Other Ambulatory Visit: Payer: Self-pay

## 2019-03-17 ENCOUNTER — Encounter: Payer: Self-pay | Admitting: *Deleted

## 2019-03-17 DIAGNOSIS — U071 COVID-19: Secondary | ICD-10-CM | POA: Diagnosis not present

## 2019-03-17 DIAGNOSIS — R55 Syncope and collapse: Secondary | ICD-10-CM | POA: Diagnosis not present

## 2019-03-17 DIAGNOSIS — Z79899 Other long term (current) drug therapy: Secondary | ICD-10-CM | POA: Diagnosis not present

## 2019-03-17 DIAGNOSIS — Z87891 Personal history of nicotine dependence: Secondary | ICD-10-CM | POA: Diagnosis not present

## 2019-03-17 DIAGNOSIS — R42 Dizziness and giddiness: Secondary | ICD-10-CM | POA: Diagnosis not present

## 2019-03-17 DIAGNOSIS — Z7982 Long term (current) use of aspirin: Secondary | ICD-10-CM | POA: Diagnosis not present

## 2019-03-17 DIAGNOSIS — Z8673 Personal history of transient ischemic attack (TIA), and cerebral infarction without residual deficits: Secondary | ICD-10-CM | POA: Diagnosis not present

## 2019-03-17 DIAGNOSIS — R531 Weakness: Secondary | ICD-10-CM | POA: Diagnosis present

## 2019-03-17 LAB — URINALYSIS, COMPLETE (UACMP) WITH MICROSCOPIC
Bacteria, UA: NONE SEEN
Bilirubin Urine: NEGATIVE
Glucose, UA: NEGATIVE mg/dL
Ketones, ur: NEGATIVE mg/dL
Leukocytes,Ua: NEGATIVE
Nitrite: NEGATIVE
Protein, ur: NEGATIVE mg/dL
Specific Gravity, Urine: 1.009 (ref 1.005–1.030)
pH: 6 (ref 5.0–8.0)

## 2019-03-17 LAB — COMPREHENSIVE METABOLIC PANEL
ALT: 11 U/L (ref 0–44)
AST: 21 U/L (ref 15–41)
Albumin: 4.4 g/dL (ref 3.5–5.0)
Alkaline Phosphatase: 67 U/L (ref 38–126)
Anion gap: 11 (ref 5–15)
BUN: 12 mg/dL (ref 6–20)
CO2: 27 mmol/L (ref 22–32)
Calcium: 9.3 mg/dL (ref 8.9–10.3)
Chloride: 102 mmol/L (ref 98–111)
Creatinine, Ser: 1.23 mg/dL (ref 0.61–1.24)
GFR calc Af Amer: >60 mL/min (ref >60)
GFR calc non Af Amer: >60 mL/min (ref >60)
Glucose, Bld: 142 mg/dL — ABNORMAL HIGH (ref 70–99)
Potassium: 3.8 mmol/L (ref 3.5–5.1)
Sodium: 140 mmol/L (ref 135–145)
Total Bilirubin: 0.7 mg/dL (ref 0.3–1.2)
Total Protein: 7.7 g/dL (ref 6.5–8.1)

## 2019-03-17 LAB — CBC
HCT: 42.3 % (ref 39.0–52.0)
Hemoglobin: 13.5 g/dL (ref 13.0–17.0)
MCH: 29.6 pg (ref 26.0–34.0)
MCHC: 31.9 g/dL (ref 30.0–36.0)
MCV: 92.8 fL (ref 80.0–100.0)
Platelets: 248 10*3/uL (ref 150–400)
RBC: 4.56 MIL/uL (ref 4.22–5.81)
RDW: 12.9 % (ref 11.5–15.5)
WBC: 7.6 10*3/uL (ref 4.0–10.5)
nRBC: 0 % (ref 0.0–0.2)

## 2019-03-17 LAB — TROPONIN I (HIGH SENSITIVITY)
Troponin I (High Sensitivity): 5 ng/L (ref <18)
Troponin I (High Sensitivity): 4 ng/L (ref <18)

## 2019-03-17 LAB — ETHANOL: Alcohol, Ethyl (B): <10 mg/dL (ref <10)

## 2019-03-17 LAB — RESPIRATORY PANEL BY RT PCR (FLU A&B, COVID)
Influenza A by PCR: NEGATIVE
Influenza B by PCR: NEGATIVE
SARS Coronavirus 2 by RT PCR: POSITIVE — AB

## 2019-03-17 MED ORDER — SODIUM CHLORIDE 0.9 % IV BOLUS
1000.0000 mL | Freq: Once | INTRAVENOUS | Status: AC
  Administered 2019-03-17: 20:00:00 1000 mL via INTRAVENOUS

## 2019-03-17 MED ORDER — ONDANSETRON 4 MG PO TBDP: 4.0000 mg | ORAL_TABLET | Freq: Three times a day (TID) | ORAL | 0 refills | Status: DC | PRN

## 2019-03-17 NOTE — ED Triage Notes (Signed)
Pt brought in via ems from home with dizziness.  Pt was cooking in the kitchen and became dizzy, pale and diaphoretic.  No chest pain or sob.  No n/v/d.  Pt alert on arrival.  md at bedside.

## 2019-03-17 NOTE — ED Notes (Signed)
Report off to paige rn  

## 2019-03-17 NOTE — ED Notes (Signed)
Pt returned from ct scan.  Pt alert.  md aware.

## 2019-03-17 NOTE — ED Notes (Addendum)
Pt brought in via ems from home with dizziness.  Pt was cooking in the kitchen and became dizzy, pale and diaphoretic.  No n/v/d.  No chest pain or sob.  On arrival to er, iv in place  md at bedside.  Pt alert and talking  Hx cva.  Iv started and labs sent.  ekg done.  Pt reports pain in both legs.

## 2019-03-17 NOTE — ED Provider Notes (Signed)
[facility] Emergency Department Provider Note  Time seen: 4:16 PM  I have reviewed the triage vital signs and the nursing notes.   HISTORY  Chief Complaint Dizziness   HPI Jeffery Grant is a 59 y.o. male with a past medical history of migraines, vertigo, CVA, presents to the emergency department for weakness and near syncope.  According to the patient he was making lunch when he began feeling very dizzy and saw black spots within his vision.  States he broke out in a cold sweat and had to sit down.  States he felt very weak and tired.  Patient denies any focal weakness or numbness.  Denies any chest pain at any point denies abdominal pain.  Largely negative review of systems otherwise.  States he has not been eating or drinking as much recently.   Past Medical History:  Diagnosis Date  . Benign paroxysmal positional vertigo 03/27/2009  . History of chicken pox   . Kidney stone   . Migraine     Patient Active Problem List   Diagnosis Date Noted  . Protein-calorie malnutrition, severe 02/27/2018  . Acute CVA (cerebrovascular accident) ([place]) 02/26/2018  . History of adenomatous polyp of colon 12/20/2014  . Scrotal nodule 12/16/2014  . 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 Dermatitis 12/16/2014  . History of kidney stones 12/16/2014  . Lesion of mucosa 12/16/2014  . Migraine 12/16/2014  . Prostate nodule 12/16/2014  . Family history of prostate cancer 08/02/2008  . Underweight 03/10/2007  . Current tobacco use 03/10/2007  . Insomnia 03/10/2007    Past Surgical History:  Procedure Laterality Date  . BRAIN SURGERY  04/2009   Cerebellar tumor excised at [facility] [place]   . VARICOSE VEIN SURGERY  1990's    Prior to Admission medications   Medication Sig Start Date End Date Taking? Authorizing Provider  amLODipine (NORVASC) 5 MG tablet TAKE 1 TABLET BY MOUTH EVERY DAY 08/19/18   Birdie Sons, MD  aspirin 81 MG chewable tablet by Nasogastric route. 03/25/18  03/25/19  [provider]  atorvastatin (LIPITOR) 20 MG tablet TAKE 1 TABLET (20 MG TOTAL) BY G TUBE ONCE DAILY 11/23/18   Birdie Sons, MD  clopidogrel (PLAVIX) 75 MG tablet TAKE 1 TABLET (75 MG TOTAL) BY G TUBE ONCE DAILY 11/23/18   Birdie Sons, MD  lidocaine (LIDODERM) 5 % PLACE 1 PATCH ON THE MOST PAINFUL AREA TOPICALLY ONCE A DAY AND REMOVE OLD PATCH PER SCHEDULE 04/27/18   [provider]  Melatonin 3 MG TABS 1 tablet (3 mg total) by Via Tube route nightly 03/25/18   [provider]  senna-docusate (SENOKOT-S) 8.6-50 MG tablet Take 2 tablets by G tube 2 (two) times daily 03/25/18 03/25/19  [provider]    Allergies  Allergen Reactions  . Penicillins     shot form    Family History  Problem Relation Age of Onset  . Lung cancer Father   . Hypertension Other     Social History Social History   Tobacco Use  . Smoking status: Former Smoker    Packs/day: 1.00    Years: 30.00    Pack years: 30.00    Types: Cigarettes  . Smokeless tobacco: Never Used  Substance Use Topics  . Alcohol use: Yes    Alcohol/week: 0.0 standard drinks    Comment: occasional use  . Drug use: No    Review of Systems Constitutional: Negative for fever.  Positive for dizziness/lightheadedness, now resolved. Cardiovascular:  Negative for chest pain. Respiratory: Negative for shortness of breath. Gastrointestinal: Negative for abdominal pain Genitourinary: Negative for urinary compaints Musculoskeletal: Negative for musculoskeletal complaints Neurological: Negative for headache All other ROS negative  ____________________________________________   PHYSICAL EXAM:  VITAL SIGNS: ED Triage Vitals  Enc Vitals Group     BP 03/17/19 1614 (!) 134/102     Pulse Rate 03/17/19 1614 (!) 57     Resp 03/17/19 1614 18     Temp 03/17/19 1614 98.3 F (36.8 C)     Temp Source 03/17/19 1614 Oral     SpO2 03/17/19 1614 100 %     Weight 03/17/19 1612 120 lb (54.4  kg)     Height 03/17/19 1612 (!) 11" (0.279 m)     Head Circumference --      Peak Flow --      Pain Score --      Pain Loc --      Pain Edu? --      Excl. in [place]? --    Constitutional: Patient is alert and oriented, no acute distress. Eyes: Normal exam ENT      Head: Normocephalic and atraumatic.      Mouth/Throat: Mucous membranes are moist. Cardiovascular: Normal rate, regular rhythm. No murmur Respiratory: Normal respiratory effort without tachypnea nor retractions. Breath sounds are clear  Gastrointestinal: Soft and nontender. No distention.  Musculoskeletal: Nontender with normal range of motion in all extremities. Neurologic:  Normal speech and language. No gross focal neurologic deficits  Skin:  Skin is warm, dry and intact.  Psychiatric: Mood and affect are normal.   ____________________________________________    EKG  EKG viewed and interpreted by myself shows a normal sinus rhythm at 53 bpm with a narrow QRS, normal axis, largely normal intervals with nonspecific ST changes but no concerning ST elevation.  ____________________________________________    RADIOLOGY  Chest x-ray negative. CT head negative for acute abnormality.  Prior CVA.  ____________________________________________   INITIAL IMPRESSION / ASSESSMENT AND PLAN / ED COURSE  Pertinent labs & imaging results that were available during my care of the patient were reviewed by me and considered in my medical decision making (see chart for details).   Patient presents from home after near syncopal episode with generalized weakness dizziness.  Differential is quite broad but would include dehydration, patient was hypotensive upon EMS arrival however this improved with IV fluids, differential would also include vasovagal event or orthostatic event, ACS or CVA, metabolic or electrolyte abnormality.  We will check labs, chest x-ray, CT scan head, Covid swab, urinalysis and continue to closely monitor.  Patient  agreeable to plan of care.  No sign of urinary tract infection.  Repeat troponin remains negative.  Highly suspect Covid to be the cause of the patient's near syncopal event today.  Discussed supportive care at home including Tylenol we will prescribe Zofran to be used as needed and plenty of fluids and rest.  Patient agreeable to plan of care.  Wife is aware of patient's diagnosis.  Discussed quarantine precautions with the patient and wife.  Jeffery Grant was evaluated in Emergency Department on 03/17/2019 for the symptoms described in the history of present illness. He was evaluated in the context of the global COVID-19 pandemic, which necessitated consideration that the patient might be at risk for infection with the SARS-CoV-2 virus that causes COVID-19. Institutional protocols and algorithms that pertain to the evaluation of patients at risk for COVID-19 are in a state of rapid change  based on information released by regulatory bodies including the CDC and federal and state organizations. These policies and algorithms were followed during the patient's care in the ED.  ____________________________________________   FINAL CLINICAL IMPRESSION(S) / ED DIAGNOSES  Weakness Near syncope COVID-19   Harvest Dark, MD 03/17/19 2015

## 2019-03-18 ENCOUNTER — Telehealth: Payer: Self-pay | Admitting: Nurse Practitioner

## 2019-03-18 NOTE — Telephone Encounter (Signed)
Called to Discuss with patient about Covid symptoms and the use of bamlanivimab, a monoclonal antibody infusion for those with mild to moderate Covid symptoms and at a high risk of hospitalization.     Pt is qualified for this infusion at the [facility] infusion center due to co-morbid conditions and/or a member of an at-risk group.     Unable to reach pt  

## 2019-03-19 ENCOUNTER — Telehealth: Payer: Self-pay | Admitting: Nurse Practitioner

## 2019-03-19 NOTE — Telephone Encounter (Signed)
Attempts made again to reach patient to discuss Covid symptoms and the use of bamlanivimab, a monoclonal antibody infusion for those with mild to moderate Covid symptoms and at a high risk of hospitalization.     Pt is qualified for this infusion at the [facility] infusion center due to co-morbid conditions and/or a member of an at-risk group.  Unable to reach and voicemail has been left.   Patient Active Problem List   Diagnosis Date Noted  . Protein-calorie malnutrition, severe 02/27/2018  . Acute CVA (cerebrovascular accident) ([place]) 02/26/2018  . History of adenomatous polyp of colon 12/20/2014  . Scrotal nodule 12/16/2014  . 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 Dermatitis 12/16/2014  . History of kidney stones 12/16/2014  . Lesion of mucosa 12/16/2014  . Migraine 12/16/2014  . Prostate nodule 12/16/2014  . Family history of prostate cancer 08/02/2008  . Underweight 03/10/2007  . Current tobacco use 03/10/2007  . Insomnia 03/10/2007    Alda Lea, AGPCNP-BC Pager: [phone] Amion: Bjorn Pippin

## 2019-05-19 ENCOUNTER — Other Ambulatory Visit: Payer: Self-pay | Admitting: Family Medicine

## 2019-05-19 NOTE — Telephone Encounter (Signed)
Requested medications are due for refill today?  Yes  Requested medications are on active medication list?  Yes  Last Refill:   Amlodipine    08/19/2018  # 90 with 2 refills Atorvastatin   11/23/2018    # 90 with 1 refill Plavix             11/23/2018   # 90 with 1 refill  Future visit scheduled?  No   Notes to Clinic:  All 3 medications failed RX refill protocol due to no valid office encounter in a 6 months and 12 months for Atorvastatin.  Patient has not been seen in a year.     Patient over due for lipid panel.

## 2019-06-18 ENCOUNTER — Other Ambulatory Visit: Payer: Self-pay | Admitting: Family Medicine

## 2019-06-18 NOTE — Telephone Encounter (Signed)
Requested medication (s) are due for refill today: yes  Requested medication (s) are on the active medication list: yes  Last refill:  05/19/2019  Future visit scheduled: no  Notes to clinic:  Needs 6 month follow up  Needs some labs recheck  Requested Prescriptions  Pending Prescriptions Disp Refills   amLODipine (NORVASC) 5 MG tablet [Pharmacy Med Name: AMLODIPINE BESYLATE 5 MG TAB] 30 tablet 0    Sig: TAKE 1 TABLET BY MOUTH EVERY DAY      Cardiovascular:  Calcium Channel Blockers Failed - 06/18/2019 12:30 PM      Failed - Valid encounter within last 6 months    Recent Outpatient Visits           1 year ago Ventricular tachycardia [facility])   [facility] [address] Birdie Sons, MD   1 year ago New daily persistent headache   [facility] Birdie Sons, MD   3 years ago Annual physical exam   [facility] Birdie Sons, MD   4 years ago Annual physical exam   [facility] Birdie Sons, MD              Passed - Last BP in normal range    BP Readings from Last 1 Encounters:  03/17/19 124/67            clopidogrel (PLAVIX) 75 MG tablet [Pharmacy Med Name: CLOPIDOGREL 75 MG TABLET] 30 tablet 0    Sig: TAKE 1 TABLET (75 MG TOTAL) BY G TUBE ONCE DAILY      Hematology: Antiplatelets - clopidogrel Failed - 06/18/2019 12:30 PM      Failed - Evaluate AST, ALT within 2 months of therapy initiation.      Failed - Valid encounter within last 6 months    Recent Outpatient Visits           1 year ago Ventricular tachycardia [facility])   [facility] Birdie Sons, MD   1 year ago New daily persistent headache   [facility] Birdie Sons, MD   3 years ago Annual physical exam   [facility] Birdie Sons, MD   4 years ago Annual physical exam   [facility] Birdie Sons, MD              Passed - ALT in normal range and within 360 days    ALT  Date  Value Ref Range Status  03/17/2019 11 0 - 44 U/L Final          Passed - AST in normal range and within 360 days    AST  Date Value Ref Range Status  03/17/2019 21 15 - 41 U/L Final          Passed - HCT in normal range and within 180 days    HCT  Date Value Ref Range Status  03/17/2019 42.3 39.0 - 52.0 % Final   Hematocrit  Date Value Ref Range Status  05/04/2018 40.1 37.5 - 51.0 % Final          Passed - HGB in normal range and within 180 days    Hemoglobin  Date Value Ref Range Status  03/17/2019 13.5 13.0 - 17.0 g/dL Final  05/04/2018 13.2 13.0 - 17.7 g/dL Final          Passed - PLT in normal range and within 180 days    Platelets  Date Value Ref Range Status  03/17/2019 248 150 - 400 K/uL Final  05/04/2018 250 150 - 450 x10E3/uL Final

## 2019-07-16 NOTE — Progress Notes (Signed)
I,Roshena L Chambers,acting as a scribe for Lelon Huh, MD.,have documented all relevant documentation on the behalf of Lelon Huh, MD,as directed by  Lelon Huh, MD while in the presence of Lelon Huh, MD.  Established patient visit   Patient: Jeffery Grant   DOB: 1960-05-09   59 y.o. Male  MRN: [id number] Visit Date: 07/19/2019  Today's healthcare provider: Lelon Huh, MD   Chief Complaint  Patient presents with  . Hyperlipidemia  . Hypertension   Subjective    HPI Lipid/Cholesterol, Follow-up  Last lipid panel Other pertinent labs  Lab Results  Component Value Date   CHOL 135 05/04/2018   HDL 69 05/04/2018   LDLCALC 48 05/04/2018   TRIG 92 05/04/2018   CHOLHDL 2.0 05/04/2018   Lab Results  Component Value Date   ALT 11 03/17/2019   AST 21 03/17/2019   PLT 248 03/17/2019   TSH 1.920 05/04/2018     He was last seen for this 1 year ago.  Management since that visit includes continuing same medication.  He reports good compliance with treatment. He is not having side effects.   Symptoms: No chest pain No chest pressure/discomfort  No dyspnea No lower extremity edema  No numbness or tingling of extremity No orthopnea  No palpitations No paroxysmal nocturnal dyspnea  No speech difficulty No syncope   Current diet: in general, an "unhealthy" diet Current exercise: bicycling and running/ jogging  The ASCVD Risk score Mikey Bussing DC Jr., et al., 2013) failed to calculate for the following reasons:   The patient has a prior MI or stroke diagnosis  --------------------------------------------------------------------------------------------------- Hypertension, follow-up  BP Readings from Last 3 Encounters:  07/19/19 132/70  03/17/19 124/67  05/04/18 (!) 156/81   Wt Readings from Last 3 Encounters:  07/19/19 123 lb (55.8 kg)  03/17/19 120 lb (54.4 kg)  05/04/18 118 lb (53.5 kg)     He was last seen for hypertension 1 year ago.  BP at that  visit was 156/81. Management since that visit includes continuing same medication.  He reports good compliance with treatment. He is not having side effects.  He is following a Regular diet. He is exercising. He does not smoke.  Use of agents associated with hypertension: none.   Outside blood pressures are not checked. Symptoms: No chest pain No chest pressure  No palpitations No syncope  No dyspnea No orthopnea  No paroxysmal nocturnal dyspnea No lower extremity edema   Pertinent labs: Lab Results  Component Value Date   CHOL 135 05/04/2018   HDL 69 05/04/2018   LDLCALC 48 05/04/2018   TRIG 92 05/04/2018   CHOLHDL 2.0 05/04/2018   Lab Results  Component Value Date   NA 140 03/17/2019   K 3.8 03/17/2019   CREATININE 1.23 03/17/2019   GFRNONAA >60 03/17/2019   GFRAA >60 03/17/2019   GLUCOSE 142 (H) 03/17/2019     The ASCVD Risk score (Goff DC Jr., et al., 2013) failed to calculate for the following reasons:   The patient has a prior MI or stroke diagnosis   ---------------------------------------------------------------------------------------------------  Follow up for CVA:  The patient was last seen for this 05/04/2018.  Changes made at last visit include continuing same medications.  He reports good compliance with treatment. He feels that condition is Unchanged. He is not having side effects.   -----------------------------------------------------------------------------------------     Medications: Outpatient Medications Prior to Visit  Medication Sig  . amLODipine (NORVASC) 5 MG tablet TAKE 1 TABLET BY  MOUTH EVERY DAY  . atorvastatin (LIPITOR) 20 MG tablet TAKE 1 TABLET (20 MG TOTAL) BY G TUBE ONCE DAILY  . clopidogrel (PLAVIX) 75 MG tablet TAKE 1 TABLET (75 MG TOTAL) BY G TUBE ONCE DAILY  . [DISCONTINUED] lidocaine (LIDODERM) 5 % PLACE 1 PATCH ON THE MOST PAINFUL AREA TOPICALLY ONCE A DAY AND REMOVE OLD PATCH PER SCHEDULE  . [DISCONTINUED] Melatonin  3 MG TABS 1 tablet (3 mg total) by Via Tube route nightly  . [DISCONTINUED] ondansetron (ZOFRAN ODT) 4 MG disintegrating tablet Take 1 tablet (4 mg total) by mouth every 8 (eight) hours as needed for nausea or vomiting. (Patient not taking: Reported on 07/19/2019)   No facility-administered medications prior to visit.    Review of Systems  Constitutional: Negative for appetite change, chills and fever.  Respiratory: Negative for chest tightness, shortness of breath and wheezing.   Cardiovascular: Negative for chest pain and palpitations.  Gastrointestinal: Negative for abdominal pain, nausea and vomiting.     Objective    BP 132/70 (BP Location: Left Arm, Patient Position: Sitting, Cuff Size: Normal)   Pulse (!) 55   Temp (!) 97.5 F (36.4 C) (Temporal)   Resp 16   Wt 123 lb (55.8 kg)   SpO2 99% Comment: room air  BMI 714.70 kg/m   Physical Exam   General: Appearance:    Thin male in no acute distress  Eyes:    PERRL, conjunctiva/corneas clear, EOM's intact       Lungs:     Clear to auscultation bilaterally, respirations unlabored  Heart:    Bradycardic. Normal rhythm. No murmurs, rubs, or gallops.   MS:   All extremities are intact.   Neurologic:   Awake, alert, oriented x 3. Mild speech and word finding impairment.         No results found for any visits on 07/19/19.  Assessment & Plan     1. Essential hypertension Well controlled.  Continue current medications.   - Comprehensive metabolic panel. He reports he has quit smoking.  - Lipid panel  2. History of CVA with residual deficit On ECASA and statin. Continue current medications.    3. History of adenomatous polyp of colon Counseled that he is due for follow up colonoscopy . He states he anticipates starting on insurance soon and will call referral at that time.   4. Prostate cancer screening  - PSA  5. Personal history of covid-19   6. Family history of prostate cancer  No follow-ups on file.       The entirety of the information documented in the History of Present Illness, Review of Systems and Physical Exam were personally obtained by me. Portions of this information were initially documented by the CMA and reviewed by me for thoroughness and accuracy.      Lelon Huh, MD  [facility] [phone] (phone) [phone] (fax)  [place]

## 2019-07-19 ENCOUNTER — Ambulatory Visit (INDEPENDENT_AMBULATORY_CARE_PROVIDER_SITE_OTHER): Payer: Self-pay | Admitting: Family Medicine

## 2019-07-19 ENCOUNTER — Other Ambulatory Visit: Payer: Self-pay

## 2019-07-19 ENCOUNTER — Encounter: Payer: Self-pay | Admitting: Family Medicine

## 2019-07-19 VITALS — BP 132/70 | HR 55 | Temp 97.5°F | Resp 16 | Ht 71.0 in | Wt 123.0 lb

## 2019-07-19 DIAGNOSIS — Z8042 Family history of malignant neoplasm of prostate: Secondary | ICD-10-CM

## 2019-07-19 DIAGNOSIS — I10 Essential (primary) hypertension: Secondary | ICD-10-CM

## 2019-07-19 DIAGNOSIS — Z8616 Personal history of COVID-19: Secondary | ICD-10-CM | POA: Insufficient documentation

## 2019-07-19 DIAGNOSIS — I6930 Unspecified sequelae of cerebral infarction: Secondary | ICD-10-CM

## 2019-07-19 DIAGNOSIS — Z125 Encounter for screening for malignant neoplasm of prostate: Secondary | ICD-10-CM

## 2019-07-19 DIAGNOSIS — Z86010 Personal history of colonic polyps: Secondary | ICD-10-CM

## 2019-07-19 HISTORY — DX: Personal history of COVID-19: Z86.16

## 2019-07-19 NOTE — Patient Instructions (Addendum)
.   Please review the attached list of medications and notify my office if there are any errors.   . You are due for a colonoscopy since you had pre-cancerous polyps removed in 2014. Please let me know as soon as your health insurance is activated so we can set up a referral  . Covid-19 vaccines: The Covid vaccines have been given to hundreds of millions of people and found to be very effective and are as safe as any other vaccine.  The [employer] vaccine has been associated with very rare dangerous blood clots, but only in adult women under the age of 88.  The risk of dying from Covid infections is much higher than having a serious reaction to the vaccine.  I strongly recommend getting fully vaccinated against Covid-19.  I recommend that adult women under 60 get fully vaccinated, but the [place] vaccines may be safer for those women than the [employer] vaccine.

## 2019-07-20 LAB — COMPREHENSIVE METABOLIC PANEL
ALT: 11 IU/L (ref 0–44)
AST: 15 IU/L (ref 0–40)
Albumin/Globulin Ratio: 1.7 (ref 1.2–2.2)
Albumin: 4.6 g/dL (ref 3.8–4.9)
Alkaline Phosphatase: 62 IU/L (ref 48–121)
BUN/Creatinine Ratio: 15 (ref 9–20)
BUN: 14 mg/dL (ref 6–24)
Bilirubin Total: <0.2 mg/dL (ref 0.0–1.2)
CO2: 25 mmol/L (ref 20–29)
Calcium: 9.6 mg/dL (ref 8.7–10.2)
Chloride: 101 mmol/L (ref 96–106)
Creatinine, Ser: 0.92 mg/dL (ref 0.76–1.27)
GFR calc Af Amer: 106 mL/min/{1.73_m2} (ref >59)
GFR calc non Af Amer: 92 mL/min/{1.73_m2} (ref >59)
Globulin, Total: 2.7 g/dL (ref 1.5–4.5)
Glucose: 91 mg/dL (ref 65–99)
Potassium: 4.7 mmol/L (ref 3.5–5.2)
Sodium: 143 mmol/L (ref 134–144)
Total Protein: 7.3 g/dL (ref 6.0–8.5)

## 2019-07-20 LAB — LIPID PANEL
Chol/HDL Ratio: 2.1 ratio (ref 0.0–5.0)
Cholesterol, Total: 126 mg/dL (ref 100–199)
HDL: 60 mg/dL (ref >39)
LDL Chol Calc (NIH): 54 mg/dL (ref 0–99)
Triglycerides: 50 mg/dL (ref 0–149)
VLDL Cholesterol Cal: 12 mg/dL (ref 5–40)

## 2019-07-20 LAB — PSA: Prostate Specific Ag, Serum: 0.8 ng/mL (ref 0.0–4.0)

## 2019-07-22 ENCOUNTER — Other Ambulatory Visit: Payer: Self-pay | Admitting: Family Medicine

## 2019-08-25 ENCOUNTER — Other Ambulatory Visit: Payer: Self-pay | Admitting: Family Medicine

## 2019-09-29 ENCOUNTER — Other Ambulatory Visit: Payer: Self-pay | Admitting: Family Medicine

## 2019-11-01 ENCOUNTER — Other Ambulatory Visit: Payer: Self-pay | Admitting: Family Medicine

## 2020-06-04 IMAGING — CT CT HEAD W/O CM
3 series · 16 of 47 positions shown, 19 images · non-contrast
Comparison: Head CT dated 02/27/2018.

CLINICAL DATA: 58-year-old male with neurologic deficit.

EXAM:
CT HEAD WITHOUT CONTRAST
TECHNIQUE: Contiguous axial images were obtained from the base of the skull
through the vertex without intravenous contrast.

[Series 2: head wo · axial · 0.46mm/px · z∈[-118,+7]mm · 10 of 31 slices shown, 13 images]
[im 3/31  brain]
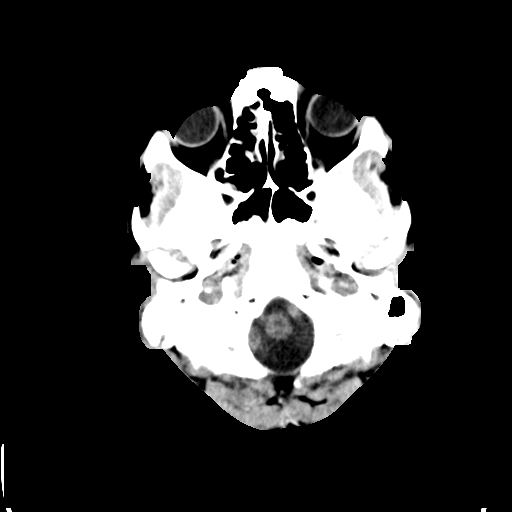
[im 3/31  bone]
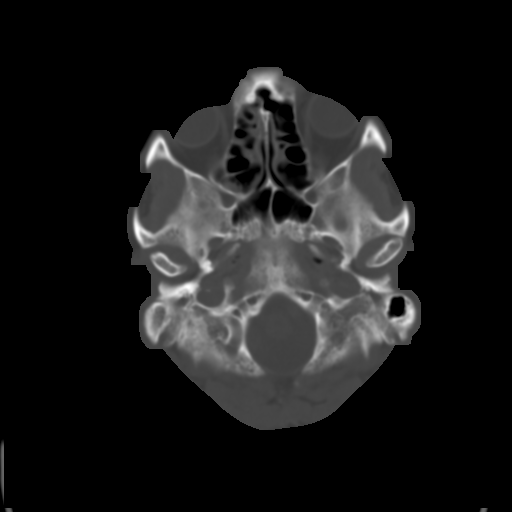
[im 6/31  brain]
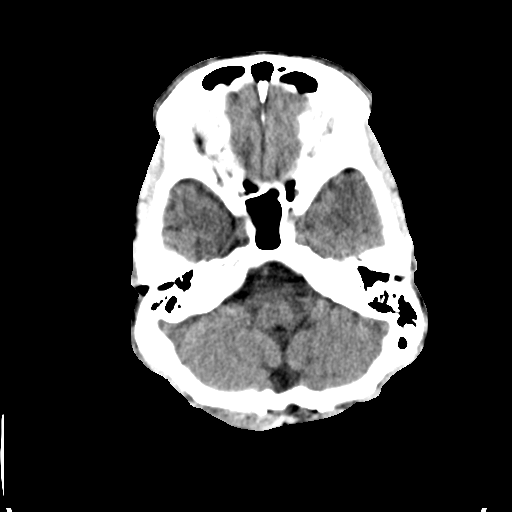
[im 9/31  brain]
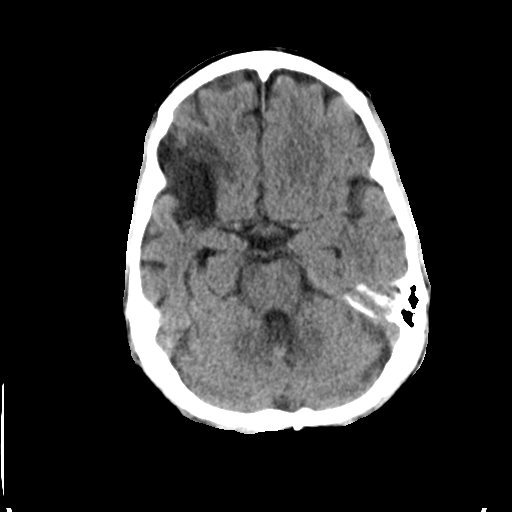
[im 11/31  brain]
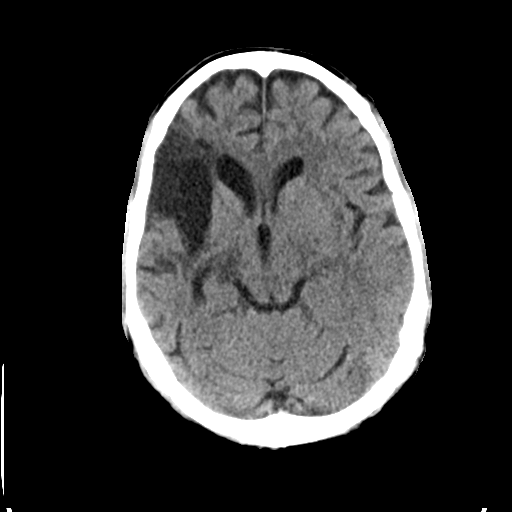
[im 14/31  brain]
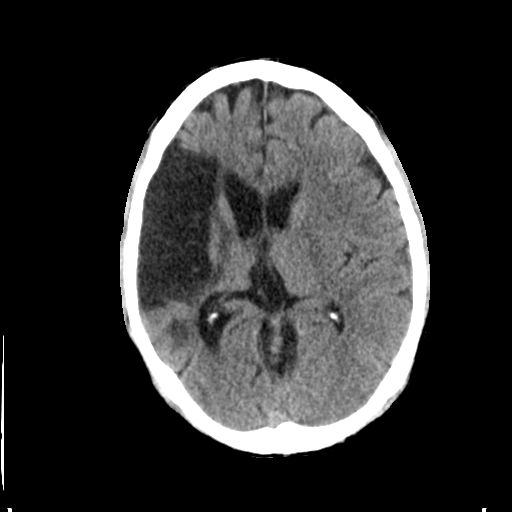
[im 14/31  bone]
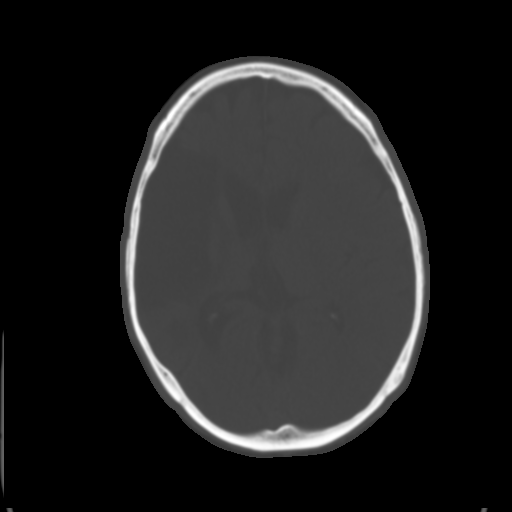
[im 17/31  brain]
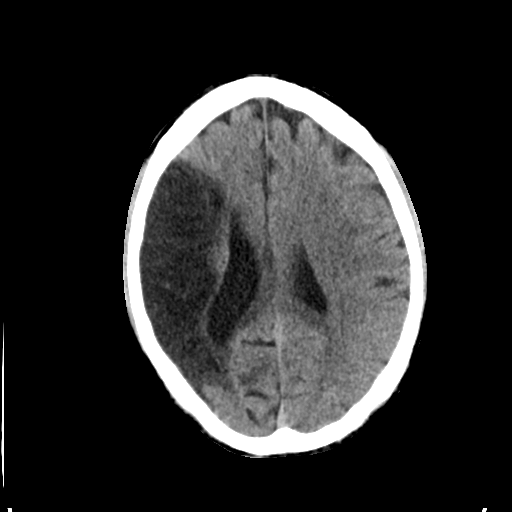
[im 20/31  brain]
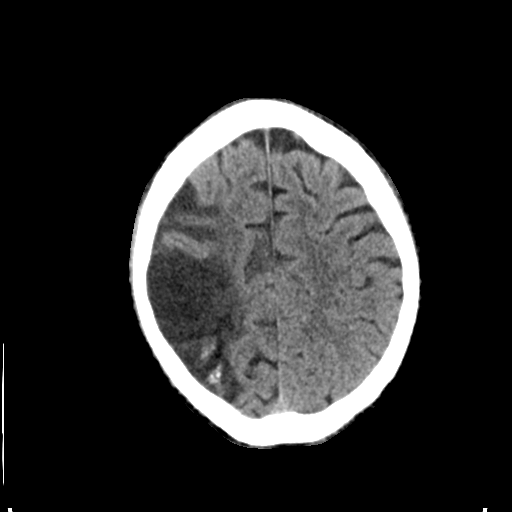
[im 23/31  brain]
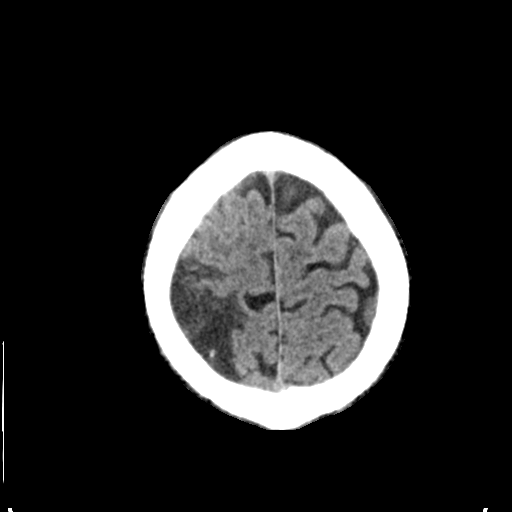
[im 25/31  brain]
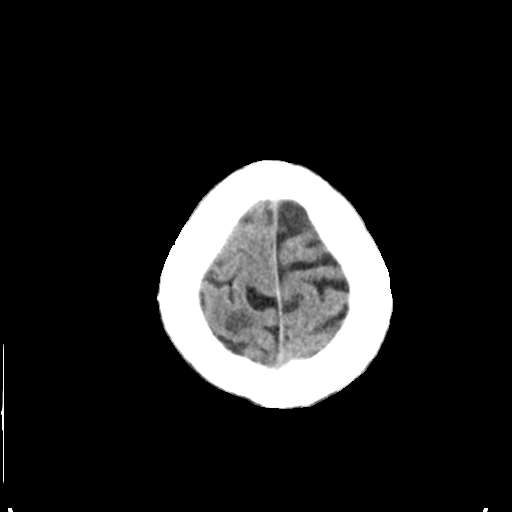
[im 25/31  bone]
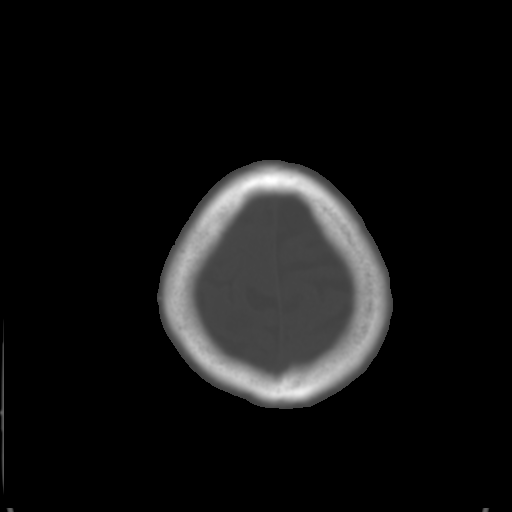
[im 28/31  brain]
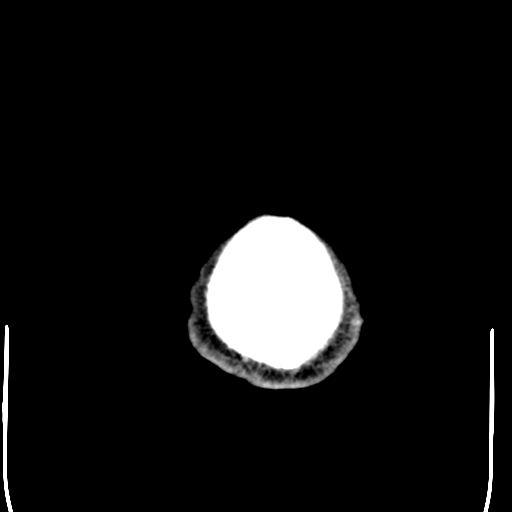

[Series 4: coronal soft tissue · coronal · 0.31mm/px · 3 of 61 slices shown]
[im 21/61  brain]
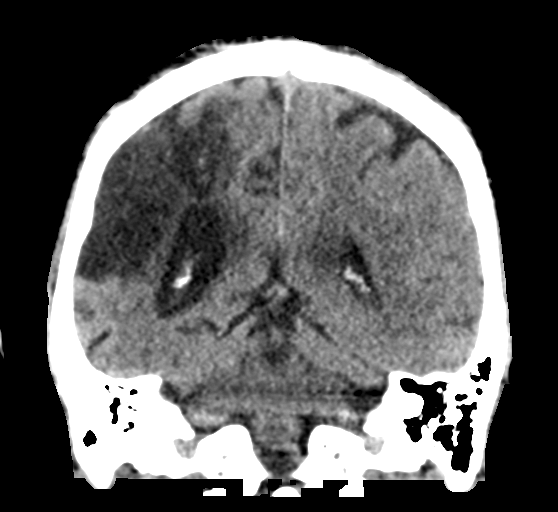
[im 27/61  brain]
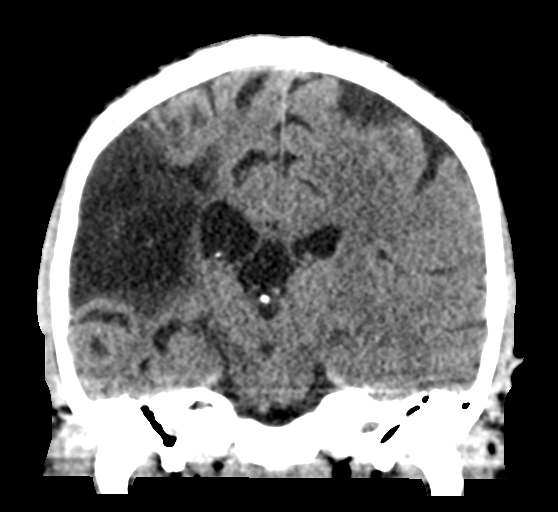
[im 34/61  brain]
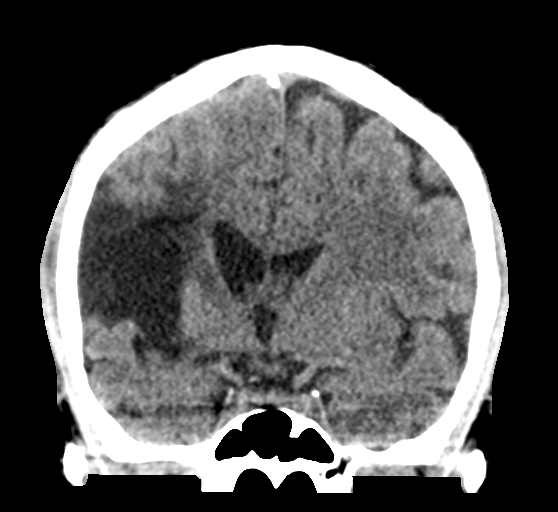

[Series 5: sagittal soft tissue · sagittal · 0.31mm/px · 3 of 52 slices shown]
[im 18/52  brain]
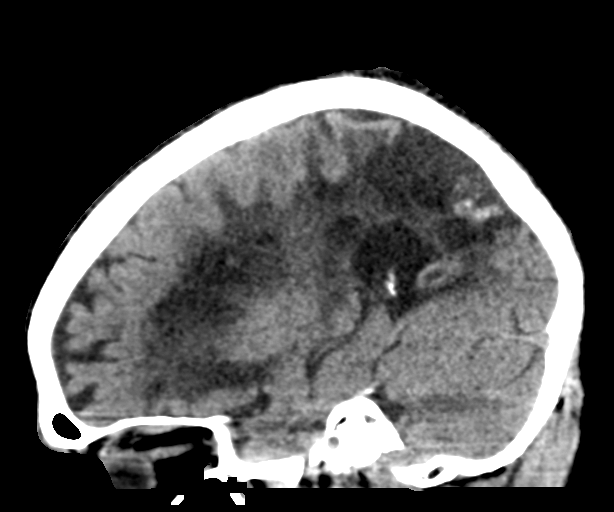
[im 26/52  brain]
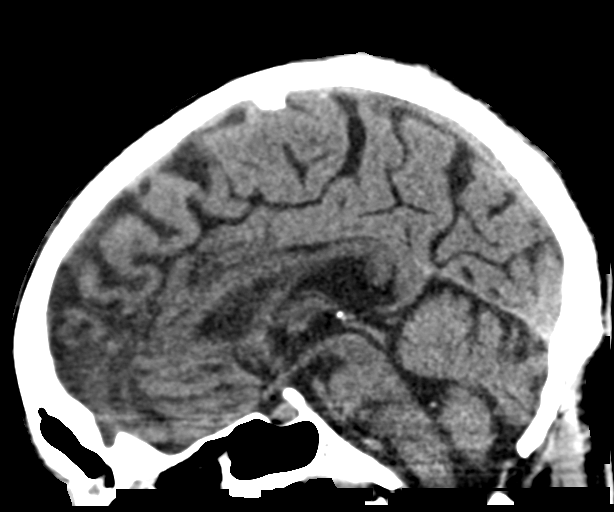
[im 35/52  brain]
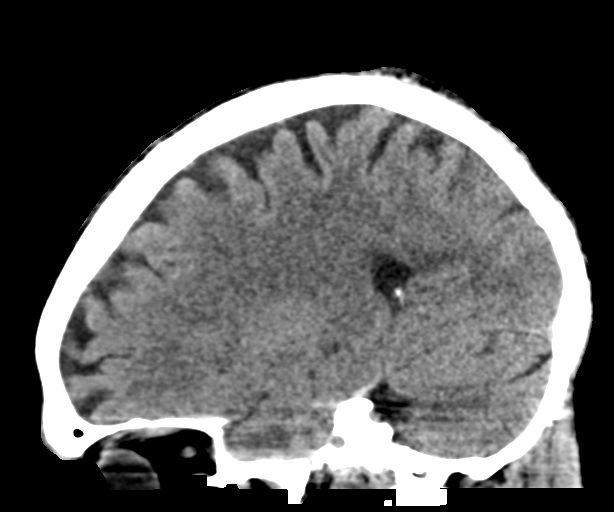

[16 of 47 positions shown; findings below may reference images not displayed]

FINDINGS: Brain: Expected evolution of previously seen large right MCA
territory with development of encephalomalacia and areas of cortical
laminar necrosis posteriorly. There is associated mild ex vacuo
dilatation of the right lateral ventricle. There is no acute
intracranial hemorrhage. No mass effect or midline shift. No
extra-axial fluid collection.

Vascular: No hyperdense vessel or unexpected calcification.

Skull: Normal. Negative for fracture or focal lesion.

Sinuses/Orbits: There is diffuse mucoperiosteal thickening of
paranasal sinuses and partial opacification of the left maxillary
sinus. The mastoid air cells are clear.

Other: None
IMPRESSION: 1. No acute intracranial hemorrhage.
2. Expected interval evolution of previously seen right large MCA
territory infarct.

## 2021-03-02 ENCOUNTER — Telehealth: Payer: Self-pay | Admitting: Family Medicine

## 2021-03-02 NOTE — Telephone Encounter (Signed)
Received Waiver of Premium disability form from [facility] on this patient, but he has not been seen since June, 2021. Requires a follow up office visit regarding condition of his disability. Please schedule appt for follow up.

## 2021-03-05 NOTE — Telephone Encounter (Signed)
Spoke with patient and scheduled him for 03/13/21 @ 10:40 a.m.

## 2021-03-13 ENCOUNTER — Ambulatory Visit (INDEPENDENT_AMBULATORY_CARE_PROVIDER_SITE_OTHER): Payer: Medicare Other | Admitting: Family Medicine

## 2021-03-13 ENCOUNTER — Other Ambulatory Visit: Payer: Self-pay

## 2021-03-13 VITALS — BP 164/90 | HR 55 | Temp 97.7°F | Wt 121.0 lb

## 2021-03-13 DIAGNOSIS — I6930 Unspecified sequelae of cerebral infarction: Secondary | ICD-10-CM

## 2021-03-13 DIAGNOSIS — I10 Essential (primary) hypertension: Secondary | ICD-10-CM | POA: Diagnosis not present

## 2021-03-13 DIAGNOSIS — H547 Unspecified visual loss: Secondary | ICD-10-CM | POA: Diagnosis not present

## 2021-03-13 MED ORDER — AMLODIPINE BESYLATE 5 MG PO TABS: 5.0000 mg | ORAL_TABLET | Freq: Every day | ORAL | 0 refills | Status: DC

## 2021-03-13 MED ORDER — ATORVASTATIN CALCIUM 20 MG PO TABS: 20.0000 mg | ORAL_TABLET | Freq: Every day | ORAL | 1 refills | Status: DC

## 2021-03-13 NOTE — Patient Instructions (Signed)
.   Please review the attached list of medications and notify my office if there are any errors.   . Please bring all of your medications to every appointment so we can make sure that our medication list is the same as yours.   

## 2021-03-13 NOTE — Progress Notes (Signed)
Established patient visit   Patient: Jeffery Grant   DOB: [birth year]   61 y.o. Male  MRN: [id number] Visit Date: 03/13/2021  Today's healthcare provider: Lelon Huh, MD   No chief complaint on file.  Subjective    HPI   Patient presents with his sister to discuss having paperwork filled out for his insurance due to his disability.  His sister states they left the paperwork here a week or so ago.  She explains that he has an Set designer with [facility] and it will pay for his health insurance premium.  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 Since his CVA he has had loss of left visual field, impaired verbal communications and left arm and leg weakness. Due to the effects of his CVA he is not able to work or drive. He was last seen here in June of 2021 and has since run out of his medications. However he does still take 81mg  aspirin every day. Overall he is unchanged since his last visit in 2021.    Medications: Outpatient Medications Prior to Visit  Medication Sig   aspirin 81 MG chewable tablet Chew by mouth daily.   amLODipine (NORVASC) 5 MG tablet TAKE 1 TABLET BY MOUTH EVERY DAY (Patient not taking: Reported on 03/13/2021)   atorvastatin (LIPITOR) 20 MG tablet TAKE 1 TABLET (20 MG TOTAL) BY G TUBE ONCE DAILY (Patient not taking: Reported on 03/13/2021)   clopidogrel (PLAVIX) 75 MG tablet TAKE 1 TABLET (75 MG TOTAL) BY G TUBE ONCE DAILY (Patient not taking: Reported on 03/13/2021)   No facility-administered medications prior to visit.    Review of Systems  Constitutional:  Negative for fatigue.  Respiratory:  Negative for shortness of breath.   Cardiovascular:  Negative for chest pain and leg swelling.  Gastrointestinal:  Negative for abdominal pain.  Neurological:  Negative for dizziness and headaches.      Objective    BP (!) 164/90    Pulse (!) 55    Temp 97.7 F (36.5 C) (Oral)    Wt 121 lb (54.9 kg)    SpO2 98%     BMI 16.88 kg/m  Vitals:   03/13/21 1040 03/13/21 1048  BP: (!) 153/95 (!) 164/90  Pulse: (!) 55   Temp: 97.7 F (36.5 C)   TempSrc: Oral   SpO2: 98%   Weight: 121 lb (54.9 kg)      Physical Exam   General: Appearance:    Thin male in no acute distress  Eyes:    PERRL, conjunctiva/corneas clear, EOM's intact       Lungs:     Clear to auscultation bilaterally, respirations unlabored  Heart:    Bradycardic. Normal rhythm. No murmurs, rubs, or gallops.    MS:   All extremities are intact.    Neurologic:   Awake, alert, oriented x 3. Normal speech formation, but very slow with word finding. Loss of vision of left visual field. Left arm +4/5 strength.+5 on right. Requires minimal assistance to stand but able to walk without assistance. CNII-XII intact.         Assessment & Plan     1. History of CVA with residual deficit He is unable to work or drive due to impaired vision, verbal communication and left sided weakness.  Continue aspirin 81 MG chewable tablet; Chew by mouth daily.  Start back on  atorvastatin (LIPITOR) 20 MG tablet; Take 1 tablet (  20 mg total) by mouth daily.  Dispense: 90 tablet; Refill: 1    2. Primary hypertension Out of medications for several moths. restart- amLODipine (NORVASC) 5 MG tablet; Take 1 tablet (5 mg total) by mouth daily.  Dispense: 90 tablet; Refill: 0  3. Partial blindness  Future Appointments  Date Time Provider [place]  04/17/2021 10:00 AM Birdie Sons, MD [facility]  07/17/2021  9:00 AM Caryn Section Kirstie Peri, MD [facility]         I,Elena D DeSanto,acting as a scribe for Lelon Huh, MD.,have documented all relevant documentation on the behalf of Lelon Huh, MD,as directed by  Lelon Huh, MD while in the presence of Lelon Huh, MD.   The entirety of the information documented in the History of Present Illness, Review of Systems and Physical Exam were personally obtained by me. Portions of this information were  initially documented by the CMA and reviewed by me for thoroughness and accuracy.     Lelon Huh, MD  [facility] [phone] (phone) [phone] (fax)  [place]

## 2021-04-17 ENCOUNTER — Encounter: Payer: Self-pay | Admitting: Family Medicine

## 2021-04-17 ENCOUNTER — Other Ambulatory Visit: Payer: Self-pay

## 2021-04-17 ENCOUNTER — Ambulatory Visit (INDEPENDENT_AMBULATORY_CARE_PROVIDER_SITE_OTHER): Payer: Medicare Other | Admitting: Family Medicine

## 2021-04-17 VITALS — BP 137/81 | HR 64 | Temp 97.8°F | Resp 14 | Wt 120.0 lb

## 2021-04-17 DIAGNOSIS — I10 Essential (primary) hypertension: Secondary | ICD-10-CM | POA: Diagnosis not present

## 2021-04-17 DIAGNOSIS — Z125 Encounter for screening for malignant neoplasm of prostate: Secondary | ICD-10-CM

## 2021-04-17 DIAGNOSIS — Z1211 Encounter for screening for malignant neoplasm of colon: Secondary | ICD-10-CM

## 2021-04-17 DIAGNOSIS — I6930 Unspecified sequelae of cerebral infarction: Secondary | ICD-10-CM | POA: Diagnosis not present

## 2021-04-17 DIAGNOSIS — Z86010 Personal history of colonic polyps: Secondary | ICD-10-CM | POA: Diagnosis not present

## 2021-04-17 NOTE — Progress Notes (Signed)
?  ? ?I,Roshena L Chambers,acting as a scribe for Lelon Huh, MD.,have documented all relevant documentation on the behalf of Lelon Huh, MD,as directed by  Lelon Huh, MD while in the presence of Lelon Huh, MD.  ? ? ?Established patient visit ? ? ?Patient: Jeffery Grant   DOB: 20-Dec-1960   61 y.o. Male  MRN: [id number] ?Visit Date: 04/17/2021 ? ?Today's healthcare provider: Lelon Huh, MD  ? ?Chief Complaint  ?Patient presents with  ? Hypertension  ? ?Subjective  ?  ?HPI  ?Hypertension, follow-up ? ?BP Readings from Last 3 Encounters:  ?04/17/21 137/81  ?03/13/21 (!) 164/90  ?07/19/19 132/70  ? Wt Readings from Last 3 Encounters:  ?04/17/21 120 lb (54.4 kg)  ?03/13/21 121 lb (54.9 kg)  ?07/19/19 123 lb (55.8 kg)  ?  ? ?He was last seen for hypertension on 03/13/2021.   ?BP at that visit was 164/90. Management since that visit includes restarting- amLODipine (NORVASC) 5 MG tablet; Take 1 tablet (5 mg total) by mouth daily (her had been out of medications for several months). ? ?He reports good compliance with treatment. ?He is not having side effects, although he does sometimes gets dizzy if he doesn't take amlodipine with food. ?He is following a Regular diet. ?He is not exercising. ?He does not smoke. ? ?Use of agents associated with hypertension: NSAIDS.  ? ?Outside blood pressures are not checked. ?Symptoms: ?No chest pain No chest pressure  ?No palpitations No syncope  ?No dyspnea No orthopnea  ?No paroxysmal nocturnal dyspnea No lower extremity edema  ? ?Pertinent labs: ?Lab Results  ?Component Value Date  ? CHOL 126 07/19/2019  ? HDL 60 07/19/2019  ? [place] 54 07/19/2019  ? TRIG 50 07/19/2019  ? CHOLHDL 2.1 07/19/2019  ? Lab Results  ?Component Value Date  ? NA 143 07/19/2019  ? K 4.7 07/19/2019  ? CREATININE 0.92 07/19/2019  ? GFRNONAA 92 07/19/2019  ? GLUCOSE 91 07/19/2019  ? TSH 1.920 05/04/2018  ?  ? ?The ASCVD Risk score (Arnett DK, et al., 2019) failed to calculate for the following  reasons: ?  The patient has a prior MI or stroke diagnosis  ? ?---------------------------------------------------------------------------------------------------  ? ?Follow up for History of CVA with residual deficit: ? ?The patient was last seen for this on 03/13/2021.   ?Changes made at last visit includes starting back on atorvastatin (LIPITOR) 20 MG tablet; Take 1 tablet (20 mg total) by mouth daily. Patient was advised to continue aspirin 81 MG chewable tablet; Chew by mouth daily. ? ? ?He reports good compliance with treatment. ?He feels that condition is Unchanged. ?He is not having side effects.  ? ?-----------------------------------------------------------------------------------------  ? ?Medications: ?Outpatient Medications Prior to Visit  ?Medication Sig  ? amLODipine (NORVASC) 5 MG tablet Take 1 tablet (5 mg total) by mouth daily.  ? aspirin 81 MG chewable tablet Chew by mouth daily.  ? atorvastatin (LIPITOR) 20 MG tablet Take 1 tablet (20 mg total) by mouth daily.  ? ?No facility-administered medications prior to visit.  ? ? ?Review of Systems  ?Constitutional:  Negative for appetite change, chills and fever.  ?Respiratory:  Negative for chest tightness, shortness of breath and wheezing.   ?Cardiovascular:  Negative for chest pain and palpitations.  ?Gastrointestinal:  Negative for abdominal pain, nausea and vomiting.  ? ? ?  Objective  ?  ?BP 137/81 (BP Location: Right Arm, Patient Position: Sitting, Cuff Size: Normal)   Pulse 64   Temp 97.8 ?F (36.6 ?C) (  Oral)   Resp 14   Wt 120 lb (54.4 kg)   SpO2 100% Comment: room air  BMI 16.74 kg/m?  ? ?Today's Vitals  ? 04/17/21 1010 04/17/21 1015  ?BP: (!) 141/87 137/81  ?Pulse: 64   ?Resp: 14   ?Temp: 97.8 ?F (36.6 ?C)   ?TempSrc: Oral   ?SpO2: 100%   ?Weight: 120 lb (54.4 kg)   ? ?Body mass index is 16.74 kg/m?.  ? ?Physical Exam  ? ?General appearance: Thin male, cooperative and in no acute distress ?Head: Normocephalic, without obvious abnormality,  atraumatic ?Respiratory: Respirations even and unlabored, normal respiratory rate ?Extremities: All extremities are intact.  ?Skin: Skin color, texture, turgor normal. No rashes seen  ?Psych: Appropriate mood and affect. ?Neurologic: Mental status: Alert, oriented to person, place, and time, thought content appropriate.  ? ? Assessment & Plan  ?  ? ?1. Primary hypertension ?Much better controlled back on amlodipine. Gets a little dizzy if he takes in the morning before eating anything. He can take it with meals or in the evening to reduce risk of dizziness.  ?- Lipid panel ? ?2. History of CVA with residual deficit ?Back on blood pressure medications, statin, and asa and tolerating well.  ? ?- CBC ?- Comprehensive metabolic panel ?- Lipid panel ? ?3. Personal history of colonic polyps ? ?- Ambulatory referral to gastroenterology for colonoscopy ? ?4. Colon cancer screening ? ?- Ambulatory referral to gastroenterology for colonoscopy  ? ?5. Prostate cancer screening ? ?- PSA Total (Reflex To Free) ([facility] only) ? ?Future Appointments  ?Date Time Provider [place]  ?07/17/2021  9:00 AM Caryn Section Kirstie Peri, MD [facility]  ?  ?   ? ?The entirety of the information documented in the History of Present Illness, Review of Systems and Physical Exam were personally obtained by me. Portions of this information were initially documented by the CMA and reviewed by me for thoroughness and accuracy.   ? ? ?Lelon Huh, MD  ?[facility] ?[phone] (phone) ?[phone] (fax) ? ?[facility]  ?

## 2021-04-18 ENCOUNTER — Other Ambulatory Visit: Payer: Self-pay

## 2021-04-18 ENCOUNTER — Telehealth: Payer: Self-pay

## 2021-04-18 LAB — CBC
Hematocrit: 42.7 % (ref 37.5–51.0)
Hemoglobin: 14.1 g/dL (ref 13.0–17.7)
MCH: 29 pg (ref 26.6–33.0)
MCHC: 33 g/dL (ref 31.5–35.7)
MCV: 88 fL (ref 79–97)
Platelets: 245 10*3/uL (ref 150–450)
RBC: 4.86 x10E6/uL (ref 4.14–5.80)
RDW: 12.9 % (ref 11.6–15.4)
WBC: 5.7 10*3/uL (ref 3.4–10.8)

## 2021-04-18 LAB — COMPREHENSIVE METABOLIC PANEL
ALT: 12 IU/L (ref 0–44)
AST: 18 IU/L (ref 0–40)
Albumin/Globulin Ratio: 1.8 (ref 1.2–2.2)
Albumin: 4.6 g/dL (ref 3.8–4.9)
Alkaline Phosphatase: 59 IU/L (ref 44–121)
BUN/Creatinine Ratio: 14 (ref 9–20)
BUN: 12 mg/dL (ref 6–24)
Bilirubin Total: 0.3 mg/dL (ref 0.0–1.2)
CO2: 26 mmol/L (ref 20–29)
Calcium: 9.5 mg/dL (ref 8.7–10.2)
Chloride: 102 mmol/L (ref 96–106)
Creatinine, Ser: 0.87 mg/dL (ref 0.76–1.27)
Globulin, Total: 2.5 g/dL (ref 1.5–4.5)
Glucose: 90 mg/dL (ref 70–99)
Potassium: 4.6 mmol/L (ref 3.5–5.2)
Sodium: 141 mmol/L (ref 134–144)
Total Protein: 7.1 g/dL (ref 6.0–8.5)
eGFR: 100 mL/min/{1.73_m2} (ref >59)

## 2021-04-18 LAB — PSA TOTAL (REFLEX TO FREE): Prostate Specific Ag, Serum: 1.5 ng/mL (ref 0.0–4.0)

## 2021-04-18 LAB — LIPID PANEL
Chol/HDL Ratio: 1.7 ratio (ref 0.0–5.0)
Cholesterol, Total: 122 mg/dL (ref 100–199)
HDL: 71 mg/dL (ref >39)
LDL Chol Calc (NIH): 40 mg/dL (ref 0–99)
Triglycerides: 42 mg/dL (ref 0–149)
VLDL Cholesterol Cal: 11 mg/dL (ref 5–40)

## 2021-04-18 NOTE — Telephone Encounter (Signed)
Patient will call us back he has had a stroke and needs help scheduling  ?

## 2021-04-18 NOTE — Telephone Encounter (Signed)
Sister is not in his release of information unable to schedule appointment sister will bring something by from register of deeds for power of attorney for [place]  ?

## 2021-06-03 ENCOUNTER — Other Ambulatory Visit: Payer: Self-pay | Admitting: Family Medicine

## 2021-06-03 DIAGNOSIS — I10 Essential (primary) hypertension: Secondary | ICD-10-CM

## 2021-07-13 NOTE — Progress Notes (Signed)
I,Jana Robinson,acting as a scribe for Lelon Huh, MD.,have documented all relevant documentation on the behalf of Lelon Huh, MD,as directed by  Lelon Huh, MD while in the presence of Lelon Huh, MD.  Annual Wellness Visit     Patient: Jeffery Grant, Male    DOB: Mar 10, 1960, 61 y.o.   MRN: [id number] Visit Date: 07/17/2021  Today's Provider: Lelon Huh, MD    Subjective    Jeffery Grant is a 61 y.o. male who presents today for his Annual Wellness Visit. He reports consuming a general diet. The patient does not participate in regular exercise at present. He generally feels well. He reports sleeping well. He does not have additional problems to discuss today.     Hypertension, follow-up  BP Readings from Last 3 Encounters:  07/17/21 127/78  04/17/21 137/81  03/13/21 (!) 164/90   Wt Readings from Last 3 Encounters:  07/17/21 126 lb 14.4 oz (57.6 kg)  04/17/21 120 lb (54.4 kg)  03/13/21 121 lb (54.9 kg)     He was last seen for hypertension 3 months ago.  BP at that visit was 137 81. Management since that visit includes: Much better controlled back on amlodipine 5 mg.  He reports excellent compliance with treatment. He is not having side effects. He is following a Regular diet. He is not exercising. He does not smoke.  Use of agents associated with hypertension: none.   Outside blood pressures are: does not take  Symptoms: No chest pain No chest pressure  No palpitations No syncope  No dyspnea No orthopnea  No paroxysmal nocturnal dyspnea No lower extremity edema   Pertinent labs Lab Results  Component Value Date   CHOL 122 04/17/2021   HDL 71 04/17/2021   LDLCALC 40 04/17/2021   TRIG 42 04/17/2021   CHOLHDL 1.7 04/17/2021   Lab Results  Component Value Date   NA 141 04/17/2021   K 4.6 04/17/2021   CREATININE 0.87 04/17/2021   EGFR 100 04/17/2021   GLUCOSE 90 04/17/2021   TSH 1.920 05/04/2018     The ASCVD Risk score (Arnett  DK, et al., 2019) failed to calculate for the following reasons:   The patient has a prior MI or stroke diagnosis  --------------------------------------------------------------------------------------------------- Follow up for History of CVA with residual deficit:    The patient was last seen for this 3 months ago. Changes made at last visit include start back on Lipitor 54m and continue Aspirin 81 daily.  He reports excellent compliance with treatment. He feels that condition is  He is having side effects. Slight dizziness   -----------------------------------------------------------------------------------------    Medications: Outpatient Medications Prior to Visit  Medication Sig   amLODipine (NORVASC) 5 MG tablet TAKE 1 TABLET (5 MG TOTAL) BY MOUTH DAILY.   aspirin 81 MG chewable tablet Chew by mouth daily.   atorvastatin (LIPITOR) 20 MG tablet Take 1 tablet (20 mg total) by mouth daily.   No facility-administered medications prior to visit.    Allergies  Allergen Reactions   Penicillins     shot form    Patient Care Team: FBirdie Sons MD as PCP - General (Family Medicine) [place] NElige Radon MD as Attending Physician (Neurology) PLaurice Record MD as Referring Physician (Cardiology)  Review of Systems  Endocrine: Positive for cold intolerance.  All other systems reviewed and are negative.Left leg/arm stays cold       Objective    Vitals: BP 127/78 (BP Location: Right Arm,  Patient Position: Sitting, Cuff Size: Normal)   Pulse 61   Temp 97.6 F (36.4 C) (Oral)   Resp 16   Wt 126 lb 14.4 oz (57.6 kg)   SpO2 100%   BMI 17.70 kg/m     Physical Exam  General: Appearance:    Thin male in no acute distress  Eyes:    PERRL, conjunctiva/corneas clear, EOM's intact       HEENT:   Both ear canals obstructed by cerumen  Lungs:     Clear to auscultation bilaterally, respirations unlabored  Heart:    Normal heart rate. Normal rhythm. No murmurs,  rubs, or gallops.    MS:   All extremities are intact.    Neurologic:   Awake, alert, oriented x 3. No apparent focal neurological defect.         Most recent functional status assessment:    07/17/2021    9:12 AM  In your present state of health, do you have any difficulty performing the following activities:  Hearing? 0  Vision? 1  Difficulty concentrating or making decisions? 0  Walking or climbing stairs? 0  Dressing or bathing? 0  Doing errands, shopping? 0   Most recent fall risk assessment:    07/17/2021    9:11 AM  Fall Risk   Falls in the past year? 0  Number falls in past yr: 0  Injury with Fall? 0  Follow up Falls evaluation completed    Most recent depression screenings:    07/17/2021    9:12 AM 03/13/2021   10:45 AM  PHQ 2/9 Scores  PHQ - 2 Score 0 2  PHQ- 9 Score 0 4   Most recent cognitive screening:     View : No data to display.         Most recent Audit-C alcohol use screening    07/17/2021    9:12 AM  Alcohol Use Disorder Test (AUDIT)  1. How often do you have a drink containing alcohol? 0  2. How many drinks containing alcohol do you have on a typical day when you are drinking? 0  3. How often do you have six or more drinks on one occasion? 0  AUDIT-C Score 0   A score of 3 or more in women, and 4 or more in men indicates increased risk for alcohol abuse, EXCEPT if all of the points are from question 1   No results found for any visits on 07/17/21.  Assessment & Plan     Annual wellness visit done today including the all of the following: Reviewed patient's Family Medical History Reviewed and updated list of patient's medical providers Assessment of cognitive impairment was done Assessed patient's functional ability Established a written schedule for health screening [place] Completed and Reviewed  Exercise Activities and Dietary recommendations  Goals   None     Immunization History  Administered Date(s)  Administered   Influenza,inj,Quad PF,6+ Mos 03/27/2018   Tdap 07/14/2009    Health Maintenance  Topic Date Due   COVID-19 Vaccine (1) Never done   Zoster Vaccines- Shingrix (1 of 2) Never done   COLONOSCOPY (Pts 45-74yr Insurance coverage will need to be confirmed)  10/28/2017   TETANUS/TDAP  07/15/2019   INFLUENZA VACCINE  09/11/2021   Hepatitis C Screening  Completed   HIV Screening  Completed   HPV VACCINES  Aged Out     Discussed health benefits of physical activity, and encouraged him to engage in regular  exercise appropriate for his age and condition.     2. Protein-calorie malnutrition, severe ([place]) Encourage to stay active and can supplement meals with boost or ensure.   3. History of adenomatous polyp of colon  - Ambulatory referral to Gastroenterology  4. Primary hypertension Well controlled.  Continue current medications.   - EKG 12-Lead  5. Bilateral hearing loss due to cerumen impaction After soaking with Debrox, ear canals were irrigated with water until left ear canal was cleared. Some residual cerumen could not be cleared from the right ear canal, but patient reported improvement in hearing bilaterally. He can use OTC Debrox drops to clear remaining cerumen.  Patient tolerated procedure well.      The entirety of the information documented in the History of Present Illness, Review of Systems and Physical Exam were personally obtained by me. Portions of this information were initially documented by the CMA and reviewed by me for thoroughness and accuracy.     Lelon Huh, MD  [facility] [phone] (phone) [phone] (fax)  [place]

## 2021-07-17 ENCOUNTER — Ambulatory Visit (INDEPENDENT_AMBULATORY_CARE_PROVIDER_SITE_OTHER): Payer: Medicare Other | Admitting: Family Medicine

## 2021-07-17 ENCOUNTER — Encounter: Payer: Self-pay | Admitting: Family Medicine

## 2021-07-17 VITALS — BP 127/78 | HR 61 | Temp 97.6°F | Resp 16 | Wt 126.9 lb

## 2021-07-17 DIAGNOSIS — Z86010 Personal history of colonic polyps: Secondary | ICD-10-CM

## 2021-07-17 DIAGNOSIS — E43 Unspecified severe protein-calorie malnutrition: Secondary | ICD-10-CM

## 2021-07-17 DIAGNOSIS — Z1211 Encounter for screening for malignant neoplasm of colon: Secondary | ICD-10-CM

## 2021-07-17 DIAGNOSIS — Z0000 Encounter for general adult medical examination without abnormal findings: Secondary | ICD-10-CM | POA: Diagnosis not present

## 2021-07-17 DIAGNOSIS — I10 Essential (primary) hypertension: Secondary | ICD-10-CM

## 2021-07-17 DIAGNOSIS — D496 Neoplasm of unspecified behavior of brain: Secondary | ICD-10-CM

## 2021-07-17 DIAGNOSIS — H6123 Impacted cerumen, bilateral: Secondary | ICD-10-CM

## 2021-07-17 NOTE — Patient Instructions (Signed)
.   Please review the attached list of medications and notify my office if there are any errors.   . Please bring all of your medications to every appointment so we can make sure that our medication list is the same as yours.   

## 2021-07-19 ENCOUNTER — Other Ambulatory Visit: Payer: Self-pay

## 2021-07-19 ENCOUNTER — Telehealth: Payer: Self-pay

## 2021-07-19 DIAGNOSIS — Z86010 Personal history of colonic polyps: Secondary | ICD-10-CM

## 2021-07-19 MED ORDER — NA SULFATE-K SULFATE-MG SULF 17.5-3.13-1.6 GM/177ML PO SOLN
1.0000 | Freq: Once | ORAL | 0 refills | Status: AC
Start: 2021-07-19 — End: 2021-07-19

## 2021-07-19 NOTE — Progress Notes (Signed)
Gastroenterology Pre-Procedure Review  Request Date: 09/17/2021 Requesting Physician: Dr. Allen Norris  PATIENT REVIEW QUESTIONS: The patient responded to the following health history questions as indicated:    1. Are you having any GI issues? no 2. Do you have a personal history of Polyps? yes (last colonoscopy) 3. Do you have a family history of Colon Cancer or Polyps? no 4. Diabetes Mellitus? no 5. Joint replacements in the past 12 months?no 6. Major health problems in the past 3 months?no 7. Any artificial heart valves, MVP, or defibrillator?no    MEDICATIONS & ALLERGIES:    Patient reports the following regarding taking any anticoagulation/antiplatelet therapy:   Plavix, Coumadin, Eliquis, Xarelto, Lovenox, Pradaxa, Brilinta, or Effient? no Aspirin? yes (81 mg)  Patient confirms/reports the following medications:  Current Outpatient Medications  Medication Sig Dispense Refill   amLODipine (NORVASC) 5 MG tablet TAKE 1 TABLET (5 MG TOTAL) BY MOUTH DAILY. 90 tablet 4   aspirin 81 MG chewable tablet Chew by mouth daily.     atorvastatin (LIPITOR) 20 MG tablet Take 1 tablet (20 mg total) by mouth daily. 90 tablet 1   No current facility-administered medications for this visit.    Patient confirms/reports the following allergies:  Allergies  Allergen Reactions   Penicillins     shot form    No orders of the defined types were placed in this encounter.   AUTHORIZATION INFORMATION Primary Insurance: 1D#: Group #:  Secondary Insurance: 1D#: Group #:  SCHEDULE INFORMATION: Date: 09/17/2021 Time: Location:[facility]

## 2021-07-19 NOTE — Telephone Encounter (Signed)
CALLED PATIENT NO ANSWER LEFT VOICEMAIL FOR A CALL BACK °Letter sent °

## 2021-08-01 ENCOUNTER — Ambulatory Visit: Payer: Medicare Other | Admitting: Physician Assistant

## 2021-08-01 ENCOUNTER — Ambulatory Visit: Payer: Self-pay

## 2021-08-01 NOTE — Progress Notes (Unsigned)
I,Prinston Kynard Robinson,acting as a Education administrator for [employer], PA-C.,have documented all relevant documentation on the behalf of Mardene Speak, PA-C,as directed by  [employer], PA-C while in the presence of [employer], PA-C.    Established patient visit   Patient: Jeffery Grant   DOB: 1960-12-08   61 y.o. Male  MRN: [id number] Visit Date: 08/02/2021  Today's healthcare provider: Mardene Speak, PA-C   Chief Complaint  Patient presents with   Dizziness   Subjective    HPI  Jeffery Grant presents today for dizziness, swimmy headed, feeling very unsteady. Occurs when lying down, standing or changing positions with a throbbing in head. Lying on right side is much worse. Dizziness goes away after after about a minute upon standing.  Onset Friday 07-27-21. Reports history of brain tumor years ago and also history of stroke.  Tumor removed 12 yrs ago per patient. Report he has not taken any of his medications since onset of dizziness.   DIZZINESS  Description of symptoms: lightheaded when turned his head to the right  Duration of episode: seconds Dizziness frequency: no history of the same Provoking factors:  movement to the right  Aggravating factors:  see above Triggered by rolling over in bed:  to the right Triggered by bending over: no Aggravated by head movement: yes Aggravated by exertion, coughing, loud noises: no Recent head injury: no Nausea: no Vomiting: no Tinnitus: no Hearing loss: no Aural fullness: no Headache: no Photophobia/phonophobia: no Unsteady gait: no Postural instability: no Diplopia, dysarthria, dysphagia or weakness: no, but sometimes with dizziness Related to exertion: no Pallor: no Diaphoresis: no Dyspnea: no Chest pain: no   Medications: Outpatient Medications Prior to Visit  Medication Sig   amLODipine (NORVASC) 5 MG tablet TAKE 1 TABLET (5 MG TOTAL) BY MOUTH DAILY.   aspirin 81 MG chewable tablet Chew by mouth daily.   atorvastatin  (LIPITOR) 20 MG tablet Take 1 tablet (20 mg total) by mouth daily.   No facility-administered medications prior to visit.    Review of Systems  {Labs  Heme  Chem  Endocrine  Serology  Results Review (optional):[id number]}   Objective    BP (!) 169/93 (BP Location: Right Arm, Patient Position: Sitting, Cuff Size: Normal)   Pulse 64   Temp 97.8 F (36.6 C) (Oral)   Resp 16   Ht '5\' 11"'$  (1.803 m)   Wt 124 lb 14.4 oz (56.7 kg)   SpO2 100%   BMI 17.42 kg/m   Vitals:   08/02/21 1045 08/02/21 1059 08/02/21 1100  BP: (!) 167/91 (!) 181/94 (!) 169/93  Pulse: (!) 58 (!) 58 64  Resp: 16    Temp: 97.8 F (36.6 C)    TempSrc: Oral    SpO2: 100%    Weight: 124 lb 14.4 oz (56.7 kg)    Height: '5\' 11"'$  (1.803 m)       Physical Exam Vitals reviewed.  Constitutional:      General: He is not in acute distress.    Appearance: Normal appearance. He is not diaphoretic.  HENT:     Head: Normocephalic and atraumatic.  Eyes:     General: No scleral icterus.    Conjunctiva/sclera: Conjunctivae normal.  Cardiovascular:     Rate and Rhythm: Normal rate and regular rhythm.     Pulses: Normal pulses.     Heart sounds: Normal heart sounds. No murmur heard. Pulmonary:     Effort: Pulmonary effort is normal. No respiratory distress.  Breath sounds: Normal breath sounds. No wheezing or rhonchi.  Abdominal:     General: Abdomen is flat.     Palpations: Abdomen is soft.  Musculoskeletal:     Cervical back: Neck supple.     Right lower leg: No edema.     Left lower leg: No edema.  Lymphadenopathy:     Cervical: No cervical adenopathy.  Skin:    General: Skin is warm and dry.     Findings: No rash.  Neurological:     Mental Status: He is alert and oriented to person, place, and time. Mental status is at baseline.     Cranial Nerves: No cranial nerve deficit.  Psychiatric:        Behavior: Behavior normal.        Thought Content: Thought content normal.        Judgment: Judgment  normal.      No results found for any visits on 08/02/21.  Assessment & Plan     1. Primary hypertension Bp today 169/93 - EKG 12-Lead Strongly encouraged to restart his BP medications Advised to measure his BP at home and bring the BP log to his next appt  2. Bradycardia - EKG 12-Lead Showed sinus bradycardia Will monitor  3. Dizziness Suspected BPPV due to positive Dix-Hallpike maneuver Provided detailed instructions for Epley maneuver at home   Fu as scheduled The patient was advised to call back or seek an in-person evaluation if the symptoms worsen or if the condition fails to improve as anticipated.  I discussed the assessment and treatment plan with the patient. The patient was provided an opportunity to ask questions and all were answered. The patient agreed with the plan and demonstrated an understanding of the instructions.  The entirety of the information documented in the History of Present Illness, Review of Systems and Physical Exam were personally obtained by me. Portions of this information were initially documented by the CMA and reviewed by me for thoroughness and accuracy.  Portions of this note were created using dictation software and may contain typographical errors.   Mardene Speak, PA-C  [facility] [phone] (phone) [phone] (fax)  [place]

## 2021-08-01 NOTE — Telephone Encounter (Signed)
  Chief Complaint: dizziness Symptoms: dizziness, swimmy headed, occurs when lying down standing or changing positions, throbbing in head Frequency: since Friday Pertinent Negatives: NA Disposition: '[]'$ ED /'[]'$ Urgent Care (no appt availability in office) / '[x]'$ Appointment(In office/virtual)/ '[]'$  [place] [facility]/ '[]'$ Home Care/ '[]'$ Refused Recommended Disposition /'[]'$ [place] [facility]/ '[]'$  Follow-up with PCP Additional Notes: pt states throbbing in head similar to when he had brain tumor and dizziness occurring more frequently. Scheduled appt for today at 1540. Pt states he can be here.    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 DESCRIPTION: "Describe your dizziness."     Swimming headed and drunk feeling  3. LIGHTHEADED: "Do you feel lightheaded?" (e.g., somewhat faint, woozy, weak upon standing)     Woozy and weak when standing 4. SEVERITY: "How bad is it?"  "Can you walk?"   - MILD: Feels slightly dizzy and unsteady, but is walking normally.   - MODERATE: Feels unsteady when walking, but not falling; interferes with normal activities (e.g., school, work).   - SEVERE: Unable to walk without falling, or requires assistance to walk without falling.     moderate 5. ONSET:  "When did the dizziness begin?"     Since Friday morning 6. AGGRAVATING FACTORS:  "Does anything make it worse?" (e.g., standing, change in head position)     Standing and changing position 9. OTHER SYMPTOMS: "Do you have any other symptoms?" (e.g., headache, weakness, numbness, vomiting, earache)     Throbbing in head  Protocols used: Dizziness - Vertigo-A-AH

## 2021-08-01 NOTE — Progress Notes (Deleted)
      I,Jana Sunil Hue,acting as a Education administrator for [employer], PA-C.,have documented all relevant documentation on the behalf of Jeffery Speak, PA-C,as directed by  [employer], PA-C while in the presence of [employer], PA-C.  Established patient visit   Patient: Jeffery Grant   DOB: February 25, 1960   61 y.o. Male  MRN: [id number] Visit Date: 08/01/2021  Today's healthcare provider: Mardene Speak, PA-C   No chief complaint on file. CC: dizziness Subjective    Dizziness    ***  Medications: Outpatient Medications Prior to Visit  Medication Sig   amLODipine (NORVASC) 5 MG tablet TAKE 1 TABLET (5 MG TOTAL) BY MOUTH DAILY.   aspirin 81 MG chewable tablet Chew by mouth daily.   atorvastatin (LIPITOR) 20 MG tablet Take 1 tablet (20 mg total) by mouth daily.   No facility-administered medications prior to visit.    Review of Systems  Neurological:  Positive for dizziness.    {Labs  Heme  Chem  Endocrine  Serology  Results Review (optional):23779}   Objective    There were no vitals taken for this visit. {Show previous vital signs (optional):23777}  Physical Exam  ***  No results found for any visits on 08/01/21.  Assessment & Plan     ***  No follow-ups on file.      {provider attestation***:1}   Jeffery Grant, Hershal Coria  [facility] [phone] (phone) [phone] (fax)  [place]

## 2021-08-02 ENCOUNTER — Ambulatory Visit (INDEPENDENT_AMBULATORY_CARE_PROVIDER_SITE_OTHER): Payer: Medicare Other | Admitting: Physician Assistant

## 2021-08-02 ENCOUNTER — Encounter: Payer: Self-pay | Admitting: Physician Assistant

## 2021-08-02 VITALS — BP 169/93 | HR 64 | Temp 97.8°F | Resp 16 | Ht 71.0 in | Wt 124.9 lb

## 2021-08-02 DIAGNOSIS — R001 Bradycardia, unspecified: Secondary | ICD-10-CM

## 2021-08-02 DIAGNOSIS — I10 Essential (primary) hypertension: Secondary | ICD-10-CM | POA: Diagnosis not present

## 2021-08-02 DIAGNOSIS — R42 Dizziness and giddiness: Secondary | ICD-10-CM

## 2021-08-08 ENCOUNTER — Encounter: Payer: Self-pay | Admitting: Physician Assistant

## 2021-08-08 ENCOUNTER — Ambulatory Visit (INDEPENDENT_AMBULATORY_CARE_PROVIDER_SITE_OTHER): Payer: Medicare Other | Admitting: Physician Assistant

## 2021-08-08 VITALS — BP 162/94 | HR 58 | Temp 97.6°F | Resp 14 | Wt 126.0 lb

## 2021-08-08 DIAGNOSIS — I10 Essential (primary) hypertension: Secondary | ICD-10-CM

## 2021-08-08 DIAGNOSIS — R42 Dizziness and giddiness: Secondary | ICD-10-CM | POA: Diagnosis not present

## 2021-08-08 MED ORDER — LISINOPRIL 5 MG PO TABS: 5.0000 mg | ORAL_TABLET | Freq: Every day | ORAL | 3 refills | Status: DC

## 2021-08-08 NOTE — Progress Notes (Signed)
I,Roshena L Chambers,acting as a Education administrator for [employer], PA-C.,have documented all relevant documentation on the behalf of Mardene Speak, PA-C,as directed by  [employer], PA-C while in the presence of [employer], PA-C.   Established patient visit   Patient: Jeffery Grant   DOB: 07-02-1960   61 y.o. Male  MRN: [id number] Visit Date: 08/08/2021  Today's healthcare provider: Mardene Speak, PA-C   Chief Complaint  Patient presents with   Hypertension   Subjective    HPI  Hypertension, follow-up  BP Readings from Last 3 Encounters:  08/08/21 (!) 162/94  08/02/21 (!) 169/93  07/17/21 127/78   Wt Readings from Last 3 Encounters:  08/08/21 126 lb (57.2 kg)  08/02/21 124 lb 14.4 oz (56.7 kg)  07/17/21 126 lb 14.4 oz (57.6 kg)     He was last seen for hypertension 6 days ago.  BP at that visit was 169/93. Management since that visit includes strongly encouraging patient to restart his BP medications. Advised to measure his BP at home and bring the BP log to his next appt.  He reports good compliance with treatment. He is not having side effects.  He is following a Regular diet. He is not exercising. He does not smoke.  Use of agents associated with hypertension: NSAIDS.   Outside blood pressures are not checked. Symptoms: No chest pain No chest pressure  No palpitations No syncope  No dyspnea No orthopnea  No paroxysmal nocturnal dyspnea No lower extremity edema   Pertinent labs Lab Results  Component Value Date   CHOL 122 04/17/2021   HDL 71 04/17/2021   LDLCALC 40 04/17/2021   TRIG 42 04/17/2021   CHOLHDL 1.7 04/17/2021   Lab Results  Component Value Date   NA 141 04/17/2021   K 4.6 04/17/2021   CREATININE 0.87 04/17/2021   EGFR 100 04/17/2021   GLUCOSE 90 04/17/2021   TSH 1.920 05/04/2018     The ASCVD Risk score (Arnett DK, et al., 2019) failed to calculate for the following reasons:   The patient has a prior MI or stroke  diagnosis  ---------------------------------------------------------------------------------------------------   Medications: Outpatient Medications Prior to Visit  Medication Sig   amLODipine (NORVASC) 5 MG tablet TAKE 1 TABLET (5 MG TOTAL) BY MOUTH DAILY.   aspirin 81 MG chewable tablet Chew by mouth daily.   atorvastatin (LIPITOR) 20 MG tablet Take 1 tablet (20 mg total) by mouth daily.   No facility-administered medications prior to visit.    Review of Systems  Constitutional:  Negative for appetite change, chills and fever.  Respiratory:  Negative for chest tightness, shortness of breath and wheezing.   Cardiovascular:  Negative for chest pain and palpitations.  Gastrointestinal:  Negative for abdominal pain, nausea and vomiting.       Objective    BP (!) 162/94 (BP Location: Left Arm, Patient Position: Sitting, Cuff Size: Normal)   Pulse (!) 58   Temp 97.6 F (36.4 C) (Oral)   Resp 14   Wt 126 lb (57.2 kg)   SpO2 100% Comment: room air  BMI 17.57 kg/m    Physical Exam Vitals reviewed.  Constitutional:      General: He is not in acute distress.    Appearance: Normal appearance. He is not diaphoretic.  HENT:     Head: Normocephalic and atraumatic.  Eyes:     General: No scleral icterus.    Extraocular Movements: Extraocular movements intact.     Conjunctiva/sclera: Conjunctivae normal.  Pupils: Pupils are equal, round, and reactive to light.  Cardiovascular:     Rate and Rhythm: Normal rate and regular rhythm.     Pulses: Normal pulses.     Heart sounds: Normal heart sounds. No murmur heard. Pulmonary:     Effort: Pulmonary effort is normal. No respiratory distress.     Breath sounds: Normal breath sounds. No wheezing or rhonchi.  Musculoskeletal:        General: Normal range of motion.     Cervical back: Normal range of motion and neck supple.     Right lower leg: No edema.     Left lower leg: No edema.  Lymphadenopathy:     Cervical: No cervical  adenopathy.  Skin:    General: Skin is warm and dry.     Findings: No rash.  Neurological:     Mental Status: He is alert and oriented to person, place, and time. Mental status is at baseline.  Psychiatric:        Mood and Affect: Mood normal.        Behavior: Behavior normal.        Thought Content: Thought content normal.        Judgment: Judgment normal.       No results found for any visits on 08/08/21.  Assessment & Plan     1. Primary hypertension BP today 162/94. Pt ordered the BP cuff to use at home. Continue to use amlodipine, increase to 10 mg - lisinopril (ZESTRIL) 5 MG tablet; Take 1 tablet (5 mg total) by mouth daily.  Dispense: 90 tablet; Refill: 3  Might contact CCM for medication assistance. Advised pt's sister to search for other local options  2. Dizziness Resolved/ Continue to use Epley maneuver if needed.  Fu in 6 weeks The patient was advised to call back or seek an in-person evaluation if the symptoms worsen or if the condition fails to improve as anticipated.  I discussed the assessment and treatment plan with the patient. The patient was provided an opportunity to ask questions and all were answered. The patient agreed with the plan and demonstrated an understanding of the instructions.  The entirety of the information documented in the History of Present Illness, Review of Systems and Physical Exam were personally obtained by me. Portions of this information were initially documented by the CMA and reviewed by me for thoroughness and accuracy.  Portions of this note were created using dictation software and may contain typographical errors.    Mardene Speak, PA-C  [facility] [phone] (phone) [phone] (fax)  [place]

## 2021-09-06 NOTE — Anesthesia Preprocedure Evaluation (Addendum)
Anesthesia Evaluation  Patient identified by MRN, date of birth, ID band Patient awake    Reviewed: Allergy & Precautions, NPO status , Patient's Chart, lab work & pertinent test results  History of Anesthesia Complications Negative for: history of anesthetic complications  Airway Mallampati: III   Neck ROM: Full    Dental no notable dental hx.    Pulmonary former smoker (quit 2020),    Pulmonary exam normal breath sounds clear to auscultation       Cardiovascular hypertension, Normal cardiovascular exam Rhythm:Regular Rate:Normal  ECG 08/02/21: Sinus bradycardia (HR 56), otherwise normal  Echo 02/28/18:  NORMAL LEFT VENTRICULAR SYSTOLIC FUNCTION  NORMAL RIGHT VENTRICULAR SYSTOLIC FUNCTION  VALVULAR REGURGITATION: TRIVIAL TR  NO VALVULAR STENOSIS  POOR ACOUSTIC PARASTERNAL AND APICAL WINDOWS DESPITE USE OF DEFINITY  CONTRAST.  POSITIVE SALINE CONTRAST STUDY.  LV AND RV FUNCTION APPEAR GROSSLY NORMAL.  NO PRIOR STUDY FOR COMPARISON   Neuro/Psych  Headaches, BPPV; blindness CVA (2020, residual left-sided weakness)    GI/Hepatic negative GI ROS,   Endo/Other  negative endocrine ROS  Renal/GU negative Renal ROS     Musculoskeletal   Abdominal   Peds  Hematology negative hematology ROS (+)   Anesthesia Other Findings Malnutrition   Reproductive/Obstetrics                            Anesthesia Physical Anesthesia Plan  ASA: 3  Anesthesia Plan: General   Post-op Pain Management:    Induction: Intravenous  PONV Risk Score and Plan: 2 and Propofol infusion, TIVA and Treatment may vary due to age or medical condition  Airway Management Planned: Natural Airway  Additional Equipment:   Intra-op Plan:   Post-operative Plan:   Informed Consent: I have reviewed the patients History and Physical, chart, labs and discussed the procedure including the risks, benefits  and alternatives for the proposed anesthesia with the patient or authorized representative who has indicated his/her understanding and acceptance.       Plan Discussed with: CRNA  Anesthesia Plan Comments: (LMA/GETA backup discussed.  Patient consented for risks of anesthesia including but not limited to:  - adverse reactions to medications - damage to eyes, teeth, lips or other oral mucosa - nerve damage due to positioning  - sore throat or hoarseness - damage to heart, brain, nerves, lungs, other parts of body or loss of life  Informed patient about role of CRNA in peri- and intra-operative care.  Patient voiced understanding.)       Anesthesia Quick Evaluation

## 2021-09-07 ENCOUNTER — Other Ambulatory Visit: Payer: Self-pay | Admitting: Family Medicine

## 2021-09-07 DIAGNOSIS — H547 Unspecified visual loss: Secondary | ICD-10-CM

## 2021-09-11 ENCOUNTER — Encounter: Payer: Self-pay | Admitting: Gastroenterology

## 2021-09-17 ENCOUNTER — Ambulatory Visit
Admission: RE | Admit: 2021-09-17 | Discharge: 2021-09-17 | Disposition: A | Discharge status: Home / Self Care | Payer: Medicare Other | Attending: Gastroenterology | Admitting: Gastroenterology

## 2021-09-17 ENCOUNTER — Encounter: Payer: Self-pay | Admitting: Gastroenterology

## 2021-09-17 ENCOUNTER — Ambulatory Visit: Payer: Medicare Other | Admitting: Anesthesiology

## 2021-09-17 ENCOUNTER — Ambulatory Visit (AMBULATORY_SURGERY_CENTER): Payer: Medicare Other | Admitting: Anesthesiology

## 2021-09-17 ENCOUNTER — Other Ambulatory Visit: Payer: Self-pay

## 2021-09-17 ENCOUNTER — Encounter
Admission: RE | Disposition: A | Discharge status: Home / Self Care | Payer: Self-pay | Source: Home / Self Care | Attending: Gastroenterology

## 2021-09-17 DIAGNOSIS — Z860100 Personal history of colon polyps, unspecified: Secondary | ICD-10-CM

## 2021-09-17 DIAGNOSIS — I10 Essential (primary) hypertension: Secondary | ICD-10-CM | POA: Diagnosis not present

## 2021-09-17 DIAGNOSIS — Z1211 Encounter for screening for malignant neoplasm of colon: Secondary | ICD-10-CM | POA: Diagnosis present

## 2021-09-17 DIAGNOSIS — I6990 Unspecified sequelae of unspecified cerebrovascular disease: Secondary | ICD-10-CM

## 2021-09-17 DIAGNOSIS — I69354 Hemiplegia and hemiparesis following cerebral infarction affecting left non-dominant side: Secondary | ICD-10-CM | POA: Diagnosis not present

## 2021-09-17 DIAGNOSIS — H547 Unspecified visual loss: Secondary | ICD-10-CM | POA: Insufficient documentation

## 2021-09-17 DIAGNOSIS — Z87891 Personal history of nicotine dependence: Secondary | ICD-10-CM | POA: Diagnosis not present

## 2021-09-17 HISTORY — PX: COLONOSCOPY WITH PROPOFOL: SHX5780

## 2021-09-17 SURGERY — COLONOSCOPY WITH PROPOFOL
Anesthesia: General | Site: Rectum

## 2021-09-17 MED ORDER — PROPOFOL 500 MG/50ML IV EMUL
INTRAVENOUS | Status: DC | PRN
  Administered 2021-09-17: 120 ug/kg/min via INTRAVENOUS

## 2021-09-17 MED ORDER — SODIUM CHLORIDE 0.9 % IV SOLN: INTRAVENOUS | Status: DC

## 2021-09-17 MED ORDER — LIDOCAINE 2% (20 MG/ML) 5 ML SYRINGE
INTRAMUSCULAR | Status: DC | PRN
  Administered 2021-09-17: 20 mg via INTRAVENOUS

## 2021-09-17 MED ORDER — PROPOFOL 10 MG/ML IV BOLUS
INTRAVENOUS | Status: DC | PRN
  Administered 2021-09-17: 30 mg via INTRAVENOUS
  Administered 2021-09-17: 70 mg via INTRAVENOUS

## 2021-09-17 MED ORDER — LACTATED RINGERS IV SOLN: INTRAVENOUS | Status: DC

## 2021-09-17 MED ORDER — STERILE WATER FOR IRRIGATION IR SOLN
Status: DC | PRN
  Administered 2021-09-17: 1

## 2021-09-17 SURGICAL SUPPLY — 21 items
CLIP HMST 235XBRD CATH ROT (MISCELLANEOUS) IMPLANT
CLIP RESOLUTION 360 11X235 (MISCELLANEOUS)
ELECT REM PT RETURN 9FT ADLT (ELECTROSURGICAL)
ELECTRODE REM PT RTRN 9FT ADLT (ELECTROSURGICAL) IMPLANT
FORCEPS BIOP RAD 4 LRG CAP 4 (CUTTING FORCEPS) IMPLANT
GOWN CVR UNV OPN BCK APRN NK (MISCELLANEOUS) ×2 IMPLANT
GOWN ISOL THUMB LOOP REG UNIV (MISCELLANEOUS) ×4
INJECTOR VARIJECT VIN23 (MISCELLANEOUS) IMPLANT
KIT DEFENDO VALVE AND CONN (KITS) IMPLANT
KIT PRC NS LF DISP ENDO (KITS) ×1 IMPLANT
KIT PROCEDURE OLYMPUS (KITS) ×2
MANIFOLD NEPTUNE II (INSTRUMENTS) ×2 IMPLANT
MARKER SPOT ENDO TATTOO 5ML (MISCELLANEOUS) IMPLANT
PROBE APC STR FIRE (PROBE) IMPLANT
RETRIEVER NET ROTH 2.5X230 LF (MISCELLANEOUS) IMPLANT
SNARE COLD EXACTO (MISCELLANEOUS) IMPLANT
SNARE SHORT THROW 13M SML OVAL (MISCELLANEOUS) IMPLANT
SNARE SNG USE RND 15MM (INSTRUMENTS) IMPLANT
TRAP ETRAP POLY (MISCELLANEOUS) IMPLANT
VARIJECT INJECTOR VIN23 (MISCELLANEOUS)
WATER STERILE IRR 250ML POUR (IV SOLUTION) ×2 IMPLANT

## 2021-09-17 NOTE — Anesthesia Postprocedure Evaluation (Signed)
Anesthesia Post Note  Patient: Jeffery Grant  Procedure(s) Performed: COLONOSCOPY WITH PROPOFOL--ABORTED POOR PREP (Rectum)     Patient location during evaluation: PACU Anesthesia Type: General Level of consciousness: awake and alert, oriented and patient cooperative Pain management: pain level controlled Vital Signs Assessment: post-procedure vital signs reviewed and stable Respiratory status: spontaneous breathing, nonlabored ventilation and respiratory function stable Cardiovascular status: blood pressure returned to baseline and stable Postop Assessment: adequate PO intake Anesthetic complications: no   No notable events documented.  Darrin Nipper

## 2021-09-17 NOTE — Transfer of Care (Signed)
Immediate Anesthesia Transfer of Care Note  Patient: Jeffery Grant  Procedure(s) Performed: COLONOSCOPY WITH PROPOFOL--ABORTED POOR PREP (Rectum)  Patient Location: PACU  Anesthesia Type:General  Level of Consciousness: awake and drowsy  Airway & Oxygen Therapy: Patient Spontanous Breathing  Post-op Assessment: Report given to RN and Post -op Vital signs reviewed and stable  Post vital signs: Reviewed  Last Vitals:  Vitals Value Taken Time  BP    Temp    Pulse    Resp    SpO2      Last Pain:  Vitals:   09/17/21 0845  PainSc: 0-No pain         Complications: No notable events documented.

## 2021-09-17 NOTE — Op Note (Signed)
[facility] Gastroenterology Patient Name: Jeffery Grant Procedure Date: 09/17/2021 9:21 AM MRN: [id number] Account #: [id number] Date of Birth: 1960-10-25 Admit Type: Outpatient Age: 61 Room: [facility] OR ROOM 01 Gender: Male Note Status: Finalized Instrument Name: [id number] Procedure:             Colonoscopy Indications:           High risk colon cancer surveillance: Personal history                         of colonic polyps Providers:             Lucilla Lame MD, MD Referring MD:          Kirstie Peri. Caryn Section, MD (Referring MD) Medicines:             Propofol per Anesthesia Complications:         No immediate complications. Procedure:             Pre-Anesthesia Assessment:                        - Prior to the procedure, a History and Physical was                         performed, and patient medications and allergies were                         reviewed. The patient's tolerance of previous                         anesthesia was also reviewed. The risks and benefits                         of the procedure and the sedation options and risks                         were discussed with the patient. All questions were                         answered, and informed consent was obtained. Prior                         Anticoagulants: The patient has taken no previous                         anticoagulant or antiplatelet agents. ASA Grade                         Assessment: II - A patient with mild systemic disease.                         After reviewing the risks and benefits, the patient                         was deemed in satisfactory condition to undergo the                         procedure.  After obtaining informed consent, the colonoscope was                         passed under direct vision. Throughout the procedure,                         the patient's blood pressure, pulse, and oxygen                         saturations were monitored  continuously. The                         Colonoscope was introduced through the anus with the                         intention of advancing to the cecum. The scope was                         advanced to the descending colon before the procedure                         was aborted. Medications were given. The colonoscopy                         was performed without difficulty. The patient                         tolerated the procedure well. The quality of the bowel                         preparation was not adequate to identify polyps 6 mm                         and larger in size. Findings:      The perianal and digital rectal examinations were normal.      A large amount of stool was found in the rectum, in the recto-sigmoid       colon, in the sigmoid colon and in the descending colon, precluding       visualization. Impression:            - Preparation of the colon was inadequate.                        - Stool in the rectum, in the recto-sigmoid colon, in                         the sigmoid colon and in the descending colon.                        - No specimens collected. Recommendation:        - Discharge patient to home.                        - Resume previous diet.                        - Continue present medications.                        -  Repeat colonoscopy because the bowel preparation was                         poor. Procedure Code(s):     --- Professional ---                        [phone], 52, Colonoscopy, flexible; diagnostic,                         including collection of specimen(s) by brushing or                         washing, when performed (separate procedure) Diagnosis Code(s):     --- Professional ---                        Z86.010, Personal history of colonic polyps CPT copyright 2019 American Medical Association. All rights reserved. The codes documented in this report are preliminary and upon coder review may  be revised to meet current compliance  requirements. Lucilla Lame MD, MD 09/17/2021 9:32:44 AM This report has been signed electronically. Number of Addenda: 0 Note Initiated On: 09/17/2021 9:21 AM Total Procedure Duration: 0 hours 1 minute 27 seconds  Estimated Blood Loss:  Estimated blood loss: none.      [facility]

## 2021-09-17 NOTE — H&P (Signed)
Lucilla Lame, MD [address] [place],  [address] Phone:[phone] Fax : [phone]  Primary Care Physician:  Birdie Sons, MD Primary Gastroenterologist:  Dr. Allen Norris  Pre-Procedure History & Physical: HPI:  Jeffery Grant is a 61 y.o. male is here for an colonoscopy.   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 Authorizing Provider  amLODipine (NORVASC) 5 MG tablet TAKE 1 TABLET (5 MG TOTAL) BY MOUTH DAILY. 06/04/21  Yes Birdie Sons, MD  atorvastatin (LIPITOR) 20 MG tablet TAKE 1 TABLET BY MOUTH EVERY DAY 09/07/21  Yes Birdie Sons, MD  aspirin 81 MG chewable tablet Chew by mouth daily. Patient not taking: Reported on 09/11/2021    [provider]  lisinopril (ZESTRIL) 5 MG tablet Take 1 tablet (5 mg total) by mouth daily. 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   Occupation: Unemployed    Comment: as of  2021  Tobacco Use   Smoking status: Former    Packs/day: 1.00    Years: 30.00    Total pack years: 30.00    Types: Cigarettes    Quit date: 2020    Years since quitting: 3.6   Smokeless tobacco: Never  Vaping Use   Vaping Use: Never used  Substance and Sexual Activity   Alcohol use: Not Currently    Alcohol/week: 0.0 standard drinks of alcohol    Comment: occasional use   Drug use: No   Sexual activity: Not on file  Other Topics Concern   Not on file  Social History Narrative   Not on file   Social Determinants of Health   Financial Resource Strain: Not on file  Food Insecurity: Not on file  Transportation Needs: Not on file  Physical Activity: Not on file  Stress: Not on file  Social Connections: Not on file  Intimate Partner Violence: Not on file    Review of Systems: See HPI, otherwise negative ROS  Physical Exam: Ht '5\' 11"'$  (1.803 m)   Wt 57.2 kg   BMI 17.57 kg/m  General:   Alert,  pleasant and cooperative in NAD Head:  Normocephalic and atraumatic. Neck:  Supple; no masses or thyromegaly. Lungs:  Clear throughout to auscultation.    Heart:  Regular rate and rhythm. Abdomen:  Soft, nontender and nondistended. Normal bowel sounds, without guarding, and without rebound.   Neurologic:  Alert and  oriented x4;  grossly normal neurologically.  Impression/Plan: Jeffery Grant is here for an colonoscopy to be performed for a history of adenomatous polyps on 2014   Risks, benefits, limitations, and alternatives regarding  colonoscopy have been reviewed with the patient.  Questions have been answered.  All parties agreeable.   Lucilla Lame, MD  09/17/2021, 8:51 AM

## 2021-09-18 ENCOUNTER — Encounter: Payer: Self-pay | Admitting: Gastroenterology

## 2021-09-18 ENCOUNTER — Telehealth: Payer: Self-pay

## 2021-09-18 NOTE — Telephone Encounter (Signed)
poor prep. Needs reschedule.

## 2021-09-24 ENCOUNTER — Ambulatory Visit: Payer: Medicare Other | Admitting: Family Medicine

## 2021-09-24 NOTE — Progress Notes (Deleted)
      Established patient visit   Patient: Jeffery Grant   DOB: May 01, 1960   61 y.o. Male  MRN: [id number] Visit Date: 09/24/2021  Today's healthcare provider: Lelon Huh, MD   No chief complaint on file.  Subjective    Hypertension, follow-up  BP Readings from Last 3 Encounters:  09/17/21 123/83  08/08/21 (!) 162/94  08/02/21 (!) 169/93   Wt Readings from Last 3 Encounters:  09/17/21 126 lb (57.2 kg)  08/08/21 126 lb (57.2 kg)  08/02/21 124 lb 14.4 oz (56.7 kg)     He was last seen for hypertension 6 weeks ago (seen by [employer], PA-C).  BP at that visit was 162/94. Management since that visit includes: Continue to use amlodipine, increase to 10 mg - lisinopril 5 MG tablet; Take 1 tablet by mouth daily.   Might contact CCM for medication assistance. Advised pt's sister to search for other local options  He reports {excellent/good/fair/poor:19665} compliance with treatment. He {is/is not:9024} having side effects. {document side effects if present:1} He is following a {diet:21022986} diet. He {is/is not:9024} exercising. He {does/does not:200015} smoke.  Use of agents associated with hypertension: {bp agents assoc with hypertension:511::"none"}.   Outside blood pressures are {***enter patient reported home BP readings, or 'not being checked':1}. Symptoms: {Yes/No:20286} chest pain {Yes/No:20286} chest pressure  {Yes/No:20286} palpitations {Yes/No:20286} syncope  {Yes/No:20286} dyspnea {Yes/No:20286} orthopnea  {Yes/No:20286} paroxysmal nocturnal dyspnea {Yes/No:20286} lower extremity edema   Pertinent labs Lab Results  Component Value Date   CHOL 122 04/17/2021   HDL 71 04/17/2021   LDLCALC 40 04/17/2021   TRIG 42 04/17/2021   CHOLHDL 1.7 04/17/2021   Lab Results  Component Value Date   NA 141 04/17/2021   K 4.6 04/17/2021   CREATININE 0.87 04/17/2021   EGFR 100 04/17/2021   GLUCOSE 90 04/17/2021   TSH 1.920 05/04/2018     The ASCVD Risk  score (Arnett DK, et al., 2019) failed to calculate for the following reasons:   The patient has a prior MI or stroke diagnosis  ---------------------------------------------------------------------------------------------------   Medications: Outpatient Medications Prior to Visit  Medication Sig   amLODipine (NORVASC) 5 MG tablet TAKE 1 TABLET (5 MG TOTAL) BY MOUTH DAILY.   aspirin 81 MG chewable tablet Chew by mouth daily. (Patient not taking: Reported on 09/11/2021)   atorvastatin (LIPITOR) 20 MG tablet TAKE 1 TABLET BY MOUTH EVERY DAY   lisinopril (ZESTRIL) 5 MG tablet Take 1 tablet (5 mg total) by mouth daily. (Patient not taking: Reported on 09/11/2021)   No facility-administered medications prior to visit.    Review of Systems  {Labs  Heme  Chem  Endocrine  Serology  Results Review (optional):23779}   Objective    There were no vitals taken for this visit. {Show previous vital signs (optional):23777}  Physical Exam  ***  No results found for any visits on 09/24/21.  Assessment & Plan     ***  No follow-ups on file.      {provider attestation***:1}   Lelon Huh, MD  [facility] [phone] (phone) [phone] (fax)  [place]

## 2021-09-25 ENCOUNTER — Other Ambulatory Visit: Payer: Self-pay | Admitting: Gastroenterology

## 2021-09-25 DIAGNOSIS — Z86010 Personal history of colonic polyps: Secondary | ICD-10-CM

## 2021-09-25 MED ORDER — NA SULFATE-K SULFATE-MG SULF 17.5-3.13-1.6 GM/177ML PO SOLN: 1.0000 | Freq: Once | ORAL | 0 refills | Status: AC

## 2021-09-25 NOTE — Telephone Encounter (Signed)
R/s for 9/14 Instructions mailed to pt

## 2021-09-25 NOTE — Addendum Note (Signed)
Addended by: Lurlean Nanny on: 09/25/2021 10:38 AM   Modules accepted: Orders

## 2021-09-27 ENCOUNTER — Telehealth: Payer: Self-pay

## 2021-09-27 DIAGNOSIS — Z86010 Personal history of colonic polyps: Secondary | ICD-10-CM

## 2021-09-27 NOTE — Telephone Encounter (Signed)
Patients sister Golden Circle [facility]) has requested to reschedule her brothers colonoscopy to the following week due to her work schedule.  Golden Circle has asked to be contacted for the arrival time of her brothers procedure.  Date has been changed to 10/29/21.  [place] has been notified of new date.  Golden Circle stated that she will go to pharmacy to pick up prep.  Thanks,  [employer], [place]

## 2021-10-23 ENCOUNTER — Encounter: Payer: Self-pay | Admitting: Gastroenterology

## 2021-10-29 ENCOUNTER — Ambulatory Visit
Admission: RE | Admit: 2021-10-29 | Discharge: 2021-10-29 | Disposition: A | Discharge status: Home / Self Care | Payer: Medicare Other | Attending: Gastroenterology | Admitting: Gastroenterology

## 2021-10-29 ENCOUNTER — Encounter
Admission: RE | Disposition: A | Discharge status: Home / Self Care | Payer: Self-pay | Source: Home / Self Care | Attending: Gastroenterology

## 2021-10-29 ENCOUNTER — Encounter: Payer: Self-pay | Admitting: Gastroenterology

## 2021-10-29 ENCOUNTER — Ambulatory Visit (AMBULATORY_SURGERY_CENTER): Payer: Medicare Other | Admitting: Anesthesiology

## 2021-10-29 ENCOUNTER — Ambulatory Visit: Payer: Medicare Other | Admitting: Anesthesiology

## 2021-10-29 ENCOUNTER — Other Ambulatory Visit: Payer: Self-pay

## 2021-10-29 DIAGNOSIS — Z86010 Personal history of colonic polyps: Secondary | ICD-10-CM | POA: Insufficient documentation

## 2021-10-29 DIAGNOSIS — Z1211 Encounter for screening for malignant neoplasm of colon: Secondary | ICD-10-CM | POA: Diagnosis not present

## 2021-10-29 DIAGNOSIS — I10 Essential (primary) hypertension: Secondary | ICD-10-CM | POA: Insufficient documentation

## 2021-10-29 DIAGNOSIS — Z8673 Personal history of transient ischemic attack (TIA), and cerebral infarction without residual deficits: Secondary | ICD-10-CM | POA: Diagnosis not present

## 2021-10-29 DIAGNOSIS — K640 First degree hemorrhoids: Secondary | ICD-10-CM | POA: Diagnosis not present

## 2021-10-29 DIAGNOSIS — Z87891 Personal history of nicotine dependence: Secondary | ICD-10-CM | POA: Diagnosis not present

## 2021-10-29 HISTORY — PX: COLONOSCOPY: SHX5424

## 2021-10-29 SURGERY — COLONOSCOPY
Anesthesia: General | Site: Rectum

## 2021-10-29 MED ORDER — ACETAMINOPHEN 160 MG/5ML PO SOLN: 975.0000 mg | Freq: Once | ORAL | Status: DC | PRN

## 2021-10-29 MED ORDER — LACTATED RINGERS IV SOLN
INTRAVENOUS | Status: DC
Start: 2021-10-29 — End: 2021-10-29

## 2021-10-29 MED ORDER — PROPOFOL 10 MG/ML IV BOLUS
INTRAVENOUS | Status: DC | PRN
  Administered 2021-10-29 (×5): 50 mg via INTRAVENOUS

## 2021-10-29 MED ORDER — LIDOCAINE HCL (CARDIAC) PF 100 MG/5ML IV SOSY
PREFILLED_SYRINGE | INTRAVENOUS | Status: DC | PRN
  Administered 2021-10-29: 100 mg via INTRAVENOUS

## 2021-10-29 MED ORDER — ACETAMINOPHEN 500 MG PO TABS: 1000.0000 mg | ORAL_TABLET | Freq: Once | ORAL | Status: DC | PRN

## 2021-10-29 MED ORDER — ONDANSETRON HCL 4 MG/2ML IJ SOLN: 4.0000 mg | Freq: Once | INTRAMUSCULAR | Status: DC | PRN

## 2021-10-29 MED ORDER — PHENYLEPHRINE HCL (PRESSORS) 10 MG/ML IV SOLN
INTRAVENOUS | Status: DC | PRN
  Administered 2021-10-29: 100 ug via INTRAVENOUS

## 2021-10-29 MED ORDER — STERILE WATER FOR IRRIGATION IR SOLN
Status: DC | PRN
  Administered 2021-10-29: 150 mL

## 2021-10-29 MED ORDER — SODIUM CHLORIDE 0.9 % IV SOLN: INTRAVENOUS | Status: DC

## 2021-10-29 SURGICAL SUPPLY — 21 items

## 2021-10-29 NOTE — Anesthesia Preprocedure Evaluation (Signed)
Anesthesia Evaluation  Patient identified by MRN, date of birth, ID band Patient awake    Reviewed: Allergy & Precautions, H&P , NPO status , Patient's Chart, lab work & pertinent test results, reviewed documented beta blocker date and time   Airway Mallampati: II  TM Distance: >3 FB Neck ROM: Full    Dental no notable dental hx.    Pulmonary neg pulmonary ROS, former smoker,    Pulmonary exam normal breath sounds clear to auscultation       Cardiovascular Exercise Tolerance: Good hypertension, negative cardio ROS Normal cardiovascular exam Rhythm:Regular Rate:Normal     Neuro/Psych  Headaches, CVA negative neurological ROS  negative psych ROS   GI/Hepatic negative GI ROS, Neg liver ROS,   Endo/Other  negative endocrine ROS  Renal/GU Renal diseasenegative Renal ROS  negative genitourinary   Musculoskeletal   Abdominal   Peds  Hematology negative hematology ROS (+)   Anesthesia Other Findings BPPV  Reproductive/Obstetrics negative OB ROS                             Anesthesia Physical Anesthesia Plan  ASA: 2  Anesthesia Plan: General   Post-op Pain Management:    Induction:   PONV Risk Score and Plan: 2 and TIVA, Propofol infusion and Treatment may vary due to age or medical condition  Airway Management Planned:   Additional Equipment:   Intra-op Plan:   Post-operative Plan:   Informed Consent: I have reviewed the patients History and Physical, chart, labs and discussed the procedure including the risks, benefits and alternatives for the proposed anesthesia with the patient or authorized representative who has indicated his/her understanding and acceptance.     Dental Advisory Given  Plan Discussed with: CRNA  Anesthesia Plan Comments:         Anesthesia Quick Evaluation

## 2021-10-29 NOTE — H&P (Signed)
Lucilla Lame, MD [address] [place], [place] [address] Phone:[phone] Fax : [phone]  Primary Care Physician:  Birdie Sons, MD Primary Gastroenterologist:  Dr. Allen Norris  Pre-Procedure History & Physical: HPI:  Jeffery Grant is a 61 y.o. male is here for an colonoscopy.   Past Medical History:  Diagnosis Date   Benign paroxysmal positional vertigo 03/27/2009   History of chicken pox    Kidney stone    Lesion of mucosa 12/16/2014   Migraine    Personal history of COVID-19 07/19/2019   03-16-2018   Protein-calorie malnutrition, severe 02/27/2018   Stroke ([place]) 02/26/2018    Past Surgical History:  Procedure Laterality Date   BRAIN SURGERY  04/2009   Cerebellar tumor excised at [place] PROPOFOL N/A 09/17/2021   Procedure: COLONOSCOPY WITH PROPOFOL--ABORTED POOR PREP;  Surgeon: Lucilla Lame, MD;  Location: [place];  Service: Endoscopy;  Laterality: N/A;  colonoscopy aborted poor prep   VARICOSE VEIN SURGERY  1990's    Prior to Admission medications   Medication Sig Start Date End Date Taking? Authorizing Provider  amLODipine (NORVASC) 5 MG tablet TAKE 1 TABLET (5 MG TOTAL) BY MOUTH DAILY. 06/04/21  Yes Birdie Sons, MD  aspirin 81 MG chewable tablet Chew by mouth daily.   Yes [provider]  atorvastatin (LIPITOR) 20 MG tablet TAKE 1 TABLET BY MOUTH EVERY DAY 09/07/21  Yes Birdie Sons, MD  lisinopril (ZESTRIL) 5 MG tablet Take 1 tablet (5 mg total) by mouth daily. 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 [employer]: prior to 2021   Occupation: Unemployed    Comment: as of 2021  Tobacco Use   Smoking status: Former    Packs/day: 1.00    Years: 30.00    Total pack years: 30.00    Types: Cigarettes    Quit date: 2020    Years since quitting: 3.7   Smokeless tobacco: Never  Vaping Use   Vaping Use: Never used  Substance and Sexual Activity   Alcohol use: Not Currently    Alcohol/week: 0.0 standard drinks of alcohol    Comment: occasional use   Drug use: No   Sexual activity: Not on file  Other Topics Concern   Not on file  Social History Narrative   Not on file   Social Determinants of Health   Financial Resource Strain: Not on file  Food Insecurity: Not on file  Transportation Needs: Not on file  Physical Activity: Not on file  Stress: Not on file  Social Connections: Not on file  Intimate Partner Violence: Not on file    Review of Systems: See HPI, otherwise negative ROS  Physical Exam: BP 133/81   Pulse 76   Temp 98.6 F (37 C) (Temporal)   Ht '5\' 11"'$  (1.803 m)   Wt 56.5  kg   SpO2 100%   BMI 17.36 kg/m  General:   Alert,  pleasant and cooperative in NAD Head:  Normocephalic and atraumatic. Neck:  Supple; no masses or thyromegaly. Lungs:  Clear throughout to auscultation.    Heart:  Regular rate and rhythm. Abdomen:  Soft, nontender and nondistended. Normal bowel sounds, without guarding, and without rebound.   Neurologic:  Alert and  oriented x4;  grossly normal neurologically.  Impression/Plan: Jeffery Grant is here for an colonoscopy to be performed for a history of adenomatous polyps on 2014   Risks, benefits, limitations, and alternatives regarding  colonoscopy have been reviewed with the patient.  Questions have been answered.  All parties agreeable.   Lucilla Lame, MD  10/29/2021, 9:32 AM

## 2021-10-29 NOTE — Op Note (Signed)
[facility] Patient Name: Jeffery Grant Procedure Date: 10/29/2021 9:24 AM MRN: [id number] Account #: [id number] Date of Birth: 02-07-1961 Admit Type: Outpatient Age: 61 Room: [facility] OR ROOM 01 Gender: Male Note Status: Finalized Instrument Name: [id number] Procedure:             Colonoscopy Indications:           High risk colon cancer surveillance: Personal history                         of colonic polyps Providers:             Lucilla Lame MD, MD Referring MD:          Kirstie Peri. Caryn Section, MD (Referring MD) Medicines:             Propofol per Anesthesia Complications:         No immediate complications. Procedure:             Pre-Anesthesia Assessment:                        - Prior to the procedure, a History and Physical was                         performed, and patient medications and allergies were                         reviewed. The patient's tolerance of previous                         anesthesia was also reviewed. The risks and benefits                         of the procedure and the sedation options and risks                         were discussed with the patient. All questions were                         answered, and informed consent was obtained. Prior                         Anticoagulants: The patient has taken no previous                         anticoagulant or antiplatelet agents. ASA Grade                         Assessment: II - A patient with mild systemic disease.                         After reviewing the risks and benefits, the patient                         was deemed in satisfactory condition to undergo the                         procedure.  After obtaining informed consent, the colonoscope was                         passed under direct vision. Throughout the procedure,                         the patient's blood pressure, pulse, and oxygen                         saturations were monitored  continuously. The                         Colonoscope was introduced through the anus and                         advanced to the the cecum, identified by appendiceal                         orifice and ileocecal valve. The colonoscopy was                         performed without difficulty. The patient tolerated                         the procedure well. The quality of the bowel                         preparation was excellent. Findings:      The perianal and digital rectal examinations were normal.      Non-bleeding internal hemorrhoids were found during retroflexion. The       hemorrhoids were Grade I (internal hemorrhoids that do not prolapse). Impression:            - Non-bleeding internal hemorrhoids.                        - No specimens collected. Recommendation:        - Discharge patient to home.                        - Resume previous diet.                        - Continue present medications.                        - Repeat colonoscopy in 7 years for surveillance. Procedure Code(s):     --- Professional ---                        [phone], Colonoscopy, flexible; diagnostic, including                         collection of specimen(s) by brushing or washing, when                         performed (separate procedure) Diagnosis Code(s):     --- Professional ---                        Z86.010, Personal history of colonic polyps  CPT copyright 2019 American Medical Association. All rights reserved. The codes documented in this report are preliminary and upon coder review may  be revised to meet current compliance requirements. Lucilla Lame MD, MD 10/29/2021 9:54:26 AM This report has been signed electronically. Number of Addenda: 0 Note Initiated On: 10/29/2021 9:24 AM Scope Withdrawal Time: 0 hours 8 minutes 31 seconds  Total Procedure Duration: 0 hours 13 minutes 34 seconds  Estimated Blood Loss:  Estimated blood loss: none.      [facility]

## 2021-10-29 NOTE — Transfer of Care (Signed)
Immediate Anesthesia Transfer of Care Note  Patient: Jeffery Grant  Procedure(s) Performed: COLONOSCOPY (Rectum)  Patient Location: PACU  Anesthesia Type: General  Level of Consciousness: awake, alert  and patient cooperative  Airway and Oxygen Therapy: Patient Spontanous Breathing and Patient connected to supplemental oxygen  Post-op Assessment: Post-op Vital signs reviewed, Patient's Cardiovascular Status Stable, Respiratory Function Stable, Patent Airway and No signs of Nausea or vomiting  Post-op Vital Signs: Reviewed and stable  Complications: There were no known notable events for this encounter.

## 2021-10-29 NOTE — Anesthesia Postprocedure Evaluation (Signed)
Anesthesia Post Note  Patient: Jeffery Grant  Procedure(s) Performed: COLONOSCOPY (Rectum)     Patient location during evaluation: PACU Anesthesia Type: General Level of consciousness: awake and alert Pain management: pain level controlled Vital Signs Assessment: post-procedure vital signs reviewed and stable Respiratory status: spontaneous breathing, nonlabored ventilation, respiratory function stable and patient connected to nasal cannula oxygen Cardiovascular status: blood pressure returned to baseline and stable Postop Assessment: no apparent nausea or vomiting Anesthetic complications: no   There were no known notable events for this encounter.  April Manson

## 2021-10-30 ENCOUNTER — Encounter: Payer: Self-pay | Admitting: Gastroenterology

## 2022-05-17 ENCOUNTER — Other Ambulatory Visit: Payer: Self-pay | Admitting: Family Medicine

## 2022-05-17 DIAGNOSIS — H547 Unspecified visual loss: Secondary | ICD-10-CM

## 2022-05-17 NOTE — Telephone Encounter (Signed)
Requested Prescriptions  Pending Prescriptions Disp Refills   atorvastatin (LIPITOR) 20 MG tablet [Pharmacy Med Name: ATORVASTATIN 20 MG TABLET] 90 tablet 0    Sig: TAKE 1 TABLET BY MOUTH EVERY DAY     Cardiovascular:  Antilipid - Statins Failed - 05/17/2022  2:27 AM      Failed - Lipid Panel in normal range within the last 12 months    Cholesterol, Total  Date Value Ref Range Status  04/17/2021 122 100 - 199 mg/dL Final   LDL Chol Calc (NIH)  Date Value Ref Range Status  04/17/2021 40 0 - 99 mg/dL Final   HDL  Date Value Ref Range Status  04/17/2021 71 >39 mg/dL Final   Triglycerides  Date Value Ref Range Status  04/17/2021 42 0 - 149 mg/dL Final         Passed - Patient is not pregnant      Passed - Valid encounter within last 12 months    Recent Outpatient Visits           9 months ago Primary hypertension   [place] [facility] [place], [place], PA-C   9 months ago Primary hypertension   [place] [facility] [place], [place], PA-C   10 months ago Medicare annual wellness visit, subsequent   [facility] Malva Limes, MD   1 year ago Colon cancer screening   [place] [facility] Malva Limes, MD   1 year ago History of CVA with residual deficit   [facility] Malva Limes, MD

## 2022-07-22 ENCOUNTER — Ambulatory Visit (INDEPENDENT_AMBULATORY_CARE_PROVIDER_SITE_OTHER): Payer: Medicare Other

## 2022-07-22 VITALS — Ht 71.0 in | Wt 124.0 lb

## 2022-07-22 DIAGNOSIS — Z0000 Encounter for general adult medical examination without abnormal findings: Secondary | ICD-10-CM | POA: Diagnosis not present

## 2022-07-22 NOTE — Progress Notes (Signed)
I connected with  Jeffery Grant and his sister, Jeffery Grant who is his PAO on 07/22/22 by telephone/speaker and verified that I am speaking with the correct person using two identifiers.  Patient Location: Home  Provider Location: Home Office  I discussed the limitations of evaluation and management by telemedicine. The patient expressed understanding and agreed to proceed.  Subjective:   Jeffery Grant is a 62 y.o. male who presents for Medicare Annual/Subsequent preventive examination.  Review of Systems   Cardiac Risk Factors include: advanced age (>74men, >51 women);dyslipidemia;hypertension;male gender    Objective:    Today's Vitals   07/22/22 0829  Weight: 124 lb (56.2 kg)  Height: 5\' 11"  (1.803 m)   Body mass index is 17.29 kg/m.     07/22/2022    9:11 AM 10/29/2021    9:17 AM 09/17/2021    8:42 AM 03/17/2019    4:13 PM 02/26/2018   11:29 AM 12/20/2015    6:07 PM  Advanced Directives  Does Patient Have a Medical Advance Directive? Yes Yes No No No No  Type of Estate agent of [employer] Power of [place];Living will      Copy of Healthcare Power of Attorney in Chart?  No - copy requested      Would patient like information on creating a medical advance directive?   No - Patient declined  No - Patient declined No - patient declined information    Current Medications (verified) Outpatient Encounter Medications as of 07/22/2022  Medication Sig   amLODipine (NORVASC) 5 MG tablet TAKE 1 TABLET (5 MG TOTAL) BY MOUTH DAILY.   aspirin 81 MG chewable tablet Chew by mouth daily.   atorvastatin (LIPITOR) 20 MG tablet TAKE 1 TABLET BY MOUTH EVERY DAY   lisinopril (ZESTRIL) 5 MG tablet Take 1 tablet (5 mg total) by mouth daily. (Patient not taking: Reported on 09/11/2021)   No facility-administered encounter medications on file as of 07/22/2022.    Allergies (verified) Penicillins   History: Past Medical History:  Diagnosis Date   Benign  paroxysmal positional vertigo 03/27/2009   History of chicken pox    Kidney stone    Lesion of mucosa 12/16/2014   Migraine    Personal history of COVID-19 07/19/2019   03-16-2018   Protein-calorie malnutrition, severe 02/27/2018   Stroke (HCC) 02/26/2018   Past Surgical History:  Procedure Laterality Date   BRAIN SURGERY  04/2009   Cerebellar tumor excised at [facility]    COLONOSCOPY N/A 10/29/2021   Procedure: COLONOSCOPY;  Surgeon: Midge Minium, MD;  Location: [facility];  Service: Endoscopy;  Laterality: N/A;   COLONOSCOPY WITH PROPOFOL N/A 09/17/2021   Procedure: COLONOSCOPY WITH PROPOFOL--ABORTED POOR PREP;  Surgeon: Midge Minium, MD;  Location: [facility];  Service: Endoscopy;  Laterality: N/A;  colonoscopy aborted poor prep   VARICOSE VEIN SURGERY  1990's   Family History  Problem Relation Age of Onset   Lung cancer Father    Hypertension Other    Social History   Socioeconomic History   Marital status: Married    Spouse name: Not on file   Number of children: 1   Years of education: Not on file   Highest education level: Not on file  Occupational History   Occupation: Merchandiser, 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[employer]    Comment: prior to 2021   Occupation: Unemployed    Comment: as of 2021  Tobacco Use   Smoking status: Former  Packs/day: 1.00    Years: 30.00    Additional pack years: 0.00    Total pack years: 30.00    Types: Cigarettes    Quit date: 2020    Years since quitting: 4.4   Smokeless tobacco: Never  Vaping Use   Vaping Use: Never used  Substance and Sexual Activity   Alcohol use: Not Currently    Alcohol/week: 0.0 standard drinks of alcohol    Comment: occasional use   Drug use: No   Sexual activity: Not on file  Other Topics Concern   Not on file  Social History Narrative   Not on file   Social Determinants of Health   Financial Resource Strain: Low Risk  (07/22/2022)   Overall Financial Resource Strain (CARDIA)     Difficulty of Paying Living Expenses: Not hard at all  Food Insecurity: No Food Insecurity (07/22/2022)   Hunger Vital Sign    Worried About Running Out of Food in the Last Year: Never true    Ran Out of Food in the Last Year: Never true  Transportation Needs: No Transportation Needs (07/22/2022)   PRAPARE - Administrator, Civil Service (Medical): No    Lack of Transportation (Non-Medical): No  Physical Activity: Insufficiently Active (07/22/2022)   Exercise Vital Sign    Days of Exercise per Week: 3 days    Minutes of Exercise per Session: 30 min  Stress: No Stress Concern Present (07/22/2022)   [employer] of Occupational Health - Occupational Stress Questionnaire    Feeling of Stress : Not at all  Social Connections: Moderately Isolated (07/22/2022)   Social Connection and Isolation Panel [NHANES]    Frequency of Communication with Friends and Family: More than three times a week    Frequency of Social Gatherings with Friends and Family: Once a week    Attends Religious Services: More than 4 times per year    Active Member of [employer] or Organizations: No    Attends Banker Meetings: Never    Marital Status: Separated    Tobacco Counseling Counseling given: Not Answered   Clinical Intake:  Pre-visit preparation completed: Yes  Pain : No/denies pain   BMI - recorded: 17.29 Nutritional Status: BMI <19  Underweight Nutritional Risks: None Diabetes: No  How often do you need to have someone help you when you read instructions, pamphlets, or other written materials from your doctor or pharmacy?: 1 - Never  Diabetic?no  Interpreter Needed?: No  Comments: lives alone Information entered by :: B.Dietrich Samuelson,LPN   Activities of Daily Living    07/22/2022    8:38 AM 10/29/2021    9:08 AM  In your present state of health, do you have any difficulty performing the following activities:  Hearing? 0 0  Vision? 1 0  Difficulty concentrating or making  decisions? 0 0  Walking or climbing stairs? 0 0  Dressing or bathing? 0 0  Doing errands, shopping? 0   Preparing Food and eating ? N   Using the Toilet? N   In the past six months, have you accidently leaked urine? N   Do you have problems with loss of bowel control? N   Managing your Medications? N   Managing your Finances? N   Housekeeping or managing your Housekeeping? N     Patient Care Team: Malva Limes, MD as PCP - General (Family Medicine) [place], Elisha Headland, MD as Attending Physician (Neurology) Louretta Shorten, MD as Referring Physician (Cardiology)  Indicate any recent Medical Services you may have received from other than [facility] providers in the past year (date may be approximate).     Assessment:   This is a routine wellness examination for [place].  Hearing/Vision screen Hearing Screening - Comments:: Adequate hearing Vision Screening - Comments:: adequate vision w/glasses Last exam in [place];due for exam  Dietary issues and exercise activities discussed: Current Exercise Habits: Home exercise routine, Type of exercise: walking, Time (Minutes): 30, Frequency (Times/Week): 3, Weekly Exercise (Minutes/Week): 90, Intensity: Mild   Goals Addressed   None    Depression Screen    07/22/2022    8:35 AM 07/17/2021    9:12 AM 03/13/2021   10:45 AM 07/19/2019    9:01 AM 02/02/2016    2:06 PM 12/20/2014    2:21 PM  PHQ 2/9 Scores  PHQ - 2 Score 0 0 2 0 0 0  PHQ- 9 Score  0 4  0 2    Fall Risk    07/22/2022    8:32 AM 07/17/2021    9:11 AM 03/13/2021   10:45 AM  Fall Risk   Falls in the past year? 0 0 0  Number falls in past yr: 0 0 0  Injury with Fall?  0 0  Risk for fall due to : No Fall Risks    Follow up Education provided;Falls prevention discussed Falls evaluation completed     FALL RISK PREVENTION PERTAINING TO THE HOME:  Any stairs in or around the home? Yes  If so, are there any without handrails? Yes  Home Grant of loose throw rugs in  walkways, pet beds, electrical cords, etc? Yes  Adequate lighting in your home to reduce risk of falls? Yes   ASSISTIVE DEVICES UTILIZED TO PREVENT FALLS:  Life alert? No  Use of a cane, walker or w/c? No  Grab bars in the bathroom? No  Shower chair or bench in shower? No  Elevated toilet seat or a handicapped toilet? No    Cognitive Function:        07/22/2022    8:56 AM  6CIT Screen  What Year? 0 points  What month? 0 points  What time? 0 points  Count back from 20 0 points  Months in reverse 2 points  Repeat phrase 0 points  Total Score 2 points    Immunizations Immunization History  Administered Date(s) Administered   Influenza,inj,Quad PF,6+ Mos 03/27/2018   Tdap 07/14/2009    TDAP status: Up to date  Flu Vaccine status: Declined, Education has been provided regarding the importance of this vaccine but patient still declined. Advised may receive this vaccine at local pharmacy or Health Dept. Aware to provide a copy of the vaccination record if obtained from local pharmacy or Health Dept. Verbalized acceptance and understanding.   Covid-19 vaccine status: Declined, Education has been provided regarding the importance of this vaccine but patient still declined. Advised may receive this vaccine at local pharmacy or Health Dept.or vaccine clinic. Aware to provide a copy of the vaccination record if obtained from local pharmacy or Health Dept. Verbalized acceptance and understanding.  Qualifies for Shingles Vaccine? No    Screening Tests Health Maintenance  Topic Date Due   COVID-19 Vaccine (1) Never done   Lung Cancer Screening  Never done   Zoster Vaccines- Shingrix (1 of 2) Never done   DTaP/Tdap/Td (2 - Td or Tdap) 07/15/2019   INFLUENZA VACCINE  09/12/2022   Medicare Annual Wellness (AWV)  07/22/2023  Colonoscopy  10/29/2028   Hepatitis C Screening  Completed   HIV Screening  Completed   HPV VACCINES  Aged Out    Health Maintenance  Health Maintenance  Due  Topic Date Due   COVID-19 Vaccine (1) Never done   Lung Cancer Screening  Never done   Zoster Vaccines- Shingrix (1 of 2) Never done   DTaP/Tdap/Td (2 - Td or Tdap) 07/15/2019    Colorectal cancer screening: Type of screening: Colonoscopy. Completed yes. Repeat every 5-10 years  Lung Cancer Screening: (Low Dose CT Chest recommended if Age 59-80 years, 30 pack-year currently smoking OR have quit w/in 15years.) does not qualify.   Lung Cancer Screening Referral: no  Additional Screening:  Hepatitis C Screening: does not qualify; Completed yes  Vision Screening: Recommended annual ophthalmology exams for early detection of glaucoma and other disorders of the eye. Is the patient up to date with their annual eye exam?  No  Who is the provider or what is the name of the office in which the patient attends annual eye exams? Does not know-dr in [place].sister will schedule. If pt is not established with a provider, would they like to be referred to a provider to establish care? No .   Dental Screening: Recommended annual dental exams for proper oral hygiene  Community Resource Referral / Chronic Care Management: CRR required this visit?  No   CCM required this visit?  No      Plan:     I have personally reviewed and noted the following in the patient's chart:   Medical and social history Use of alcohol, tobacco or illicit drugs  Current medications and supplements including opioid prescriptions. Patient is not currently taking opioid prescriptions. Functional ability and status Nutritional status Physical activity Advanced directives List of other physicians Hospitalizations, surgeries, and ER visits in previous 12 months Vitals Screenings to include cognitive, depression, and falls Referrals and appointments  In addition, I have reviewed and discussed with patient certain preventive protocols, quality metrics, and best practice recommendations. A written personalized  care plan for preventive services as well as general preventive health recommendations were provided to patient.     Sue Lush, LPN   10/10/5619   Nurse Notes: pt says he is doing alright. He and his sister (POA) were on phone call. She indicates some of the questions he does not understand since his stroke in 2020. They have no questions or concerns at this time.

## 2022-07-22 NOTE — Patient Instructions (Signed)
[name] , Thank you for taking time to come for your Medicare Wellness Visit. I appreciate your ongoing commitment to your health goals. Please review the following plan we discussed and let me know if I can assist you in the future.   These are the goals we discussed:  Goals   None     This is a list of the screening recommended for you and due dates:  Health Maintenance  Topic Date Due   COVID-19 Vaccine (1) Never done   Screening for Lung Cancer  Never done   Zoster (Shingles) Vaccine (1 of 2) Never done   DTaP/Tdap/Td vaccine (2 - Td or Tdap) 07/15/2019   Flu Shot  09/12/2022   Medicare Annual Wellness Visit  07/22/2023   Colon Cancer Screening  10/29/2028   Hepatitis C Screening  Completed   HIV Screening  Completed   HPV Vaccine  Aged Out    Advanced directives: yes  Conditions/risks identified: none  Next appointment: Follow up in one year for your annual wellness visit 07/23/2023 @ 8:15am telephone  Preventive Care 40-64 Years, Male Preventive care refers to lifestyle choices and visits with your health care provider that can promote health and wellness. What does preventive care include? A yearly physical exam. This is also called an annual well check. Dental exams once or twice a year. Routine eye exams. Ask your health care provider how often you should have your eyes checked. Personal lifestyle choices, including: Daily care of your teeth and gums. Regular physical activity. Eating a healthy diet. Avoiding tobacco and drug use. Limiting alcohol use. Practicing safe sex. Taking low-dose aspirin every day starting at age 41. What happens during an annual well check? The services and screenings done by your health care provider during your annual well check will depend on your age, overall health, lifestyle risk factors, and family history of disease. Counseling  Your health care provider may ask you questions about your: Alcohol use. Tobacco use. Drug  use. Emotional well-being. Home and relationship well-being. Sexual activity. Eating habits. Work and work Astronomer. Screening  You may have the following tests or measurements: Height, weight, and BMI. Blood pressure. Lipid and cholesterol levels. These may be checked every 5 years, or more frequently if you are over 34 years old. Skin check. Lung cancer screening. You may have this screening every year starting at age 89 if you have a 30-pack-year history of smoking and currently smoke or have quit within the past 15 years. Fecal occult blood test (FOBT) of the stool. You may have this test every year starting at age 83. Flexible sigmoidoscopy or colonoscopy. You may have a sigmoidoscopy every 5 years or a colonoscopy every 10 years starting at age 4. Prostate cancer screening. Recommendations will vary depending on your family history and other risks. Hepatitis C blood test. Hepatitis B blood test. Sexually transmitted disease (STD) testing. Diabetes screening. This is done by checking your blood sugar (glucose) after you have not eaten for a while (fasting). You may have this done every 1-3 years. Discuss your test results, treatment options, and if necessary, the need for more tests with your health care provider. Vaccines  Your health care provider may recommend certain vaccines, such as: Influenza vaccine. This is recommended every year. Tetanus, diphtheria, and acellular pertussis (Tdap, Td) vaccine. You may need a Td booster every 10 years. Zoster vaccine. You may need this after age 38. Pneumococcal 13-valent conjugate (PCV13) vaccine. You may need this if you  have certain conditions and have not been vaccinated. Pneumococcal polysaccharide (PPSV23) vaccine. You may need one or two doses if you smoke cigarettes or if you have certain conditions. Talk to your health care provider about which screenings and vaccines you need and how often you need them. This information is  not intended to replace advice given to you by your health care provider. Make sure you discuss any questions you have with your health care provider. Document Released: 02/24/2015 Document Revised: 10/18/2015 Document Reviewed: 11/29/2014 Elsevier Interactive Patient Education  2017 [employer].  Fall Prevention in the Home Falls can cause injuries. They can happen to people of all ages. There are many things you can do to make your home safe and to help prevent falls. What can I do on the outside of my home? Regularly fix the edges of walkways and driveways and fix any cracks. Remove anything that might make you trip as you walk through a door, such as a raised step or threshold. Trim any bushes or trees on the path to your home. Use bright outdoor lighting. Clear any walking paths of anything that might make someone trip, such as rocks or tools. Regularly check to see if handrails are loose or broken. Make sure that both sides of any steps have handrails. Any raised decks and porches should have guardrails on the edges. Have any leaves, snow, or ice cleared regularly. Use sand or salt on walking paths during winter. Clean up any spills in your garage right away. This includes oil or grease spills. What can I do in the bathroom? Use night lights. Install grab bars by the toilet and in the tub and shower. Do not use towel bars as grab bars. Use non-skid mats or decals in the tub or shower. If you need to sit down in the shower, use a plastic, non-slip stool. Keep the floor dry. Clean up any water that spills on the floor as soon as it happens. Remove soap buildup in the tub or shower regularly. Attach bath mats securely with double-sided non-slip rug tape. Do not have throw rugs and other things on the floor that can make you trip. What can I do in the bedroom? Use night lights. Make sure that you have a light by your bed that is easy to reach. Do not use any sheets or blankets that  are too big for your bed. They should not hang down onto the floor. Have a firm chair that has side arms. You can use this for support while you get dressed. Do not have throw rugs and other things on the floor that can make you trip. What can I do in the kitchen? Clean up any spills right away. Avoid walking on wet floors. Keep items that you use a lot in easy-to-reach places. If you need to reach something above you, use a strong step stool that has a grab bar. Keep electrical cords out of the way. Do not use floor polish or wax that makes floors slippery. If you must use wax, use non-skid floor wax. Do not have throw rugs and other things on the floor that can make you trip. What can I do with my stairs? Do not leave any items on the stairs. Make sure that there are handrails on both sides of the stairs and use them. Fix handrails that are broken or loose. Make sure that handrails are as long as the stairways. Check any carpeting to make sure that it is firmly  attached to the stairs. Fix any carpet that is loose or worn. Avoid having throw rugs at the top or bottom of the stairs. If you do have throw rugs, attach them to the floor with carpet tape. Make sure that you have a light switch at the top of the stairs and the bottom of the stairs. If you do not have them, ask someone to add them for you. What else can I do to help prevent falls? Wear shoes that: Do not have high heels. Have rubber bottoms. Are comfortable and fit you well. Are closed at the toe. Do not wear sandals. If you use a stepladder: Make sure that it is fully opened. Do not climb a closed stepladder. Make sure that both sides of the stepladder are locked into place. Ask someone to hold it for you, if possible. Clearly mark and make sure that you can see: Any grab bars or handrails. First and last steps. Where the edge of each step is. Use tools that help you move around (mobility aids) if they are needed. These  include: Canes. Walkers. Scooters. Crutches. Turn on the lights when you go into a dark area. Replace any light bulbs as soon as they burn out. Set up your furniture so you have a clear path. Avoid moving your furniture around. If any of your floors are uneven, fix them. If there are any pets around you, be aware of where they are. Review your medicines with your doctor. Some medicines can make you feel dizzy. This can increase your chance of falling. Ask your doctor what other things that you can do to help prevent falls. This information is not intended to replace advice given to you by your health care provider. Make sure you discuss any questions you have with your health care provider. Document Released: 11/24/2008 Document Revised: 07/06/2015 Document Reviewed: 03/04/2014 Elsevier Interactive Patient Education  2017 [employer].

## 2022-08-14 ENCOUNTER — Other Ambulatory Visit: Payer: Self-pay | Admitting: Family Medicine

## 2022-08-14 DIAGNOSIS — H547 Unspecified visual loss: Secondary | ICD-10-CM

## 2022-08-15 ENCOUNTER — Other Ambulatory Visit: Payer: Self-pay | Admitting: Family Medicine

## 2022-08-15 DIAGNOSIS — I10 Essential (primary) hypertension: Secondary | ICD-10-CM

## 2022-08-16 NOTE — Telephone Encounter (Signed)
Requested medication (s) are due for refill today: yes  Requested medication (s) are on the active medication list: yes  Last refill:  06/04/21 #90 4 RF   Future visit scheduled: no   Notes to clinic:  Pt > 3 months overdue for OV- called pt and LM on VM to call office to schedule appt   Requested Prescriptions  Pending Prescriptions Disp Refills   amLODipine (NORVASC) 5 MG tablet [Pharmacy Med Name: AMLODIPINE BESYLATE 5 MG TAB] 90 tablet 4    Sig: TAKE 1 TABLET (5 MG TOTAL) BY MOUTH DAILY.     Cardiovascular: Calcium Channel Blockers 2 Passed - 08/15/2022  2:40 AM      Passed - Last BP in normal range    BP Readings from Last 1 Encounters:  10/29/21 93/69         Passed - Last Heart Rate in normal range    Pulse Readings from Last 1 Encounters:  10/29/21 71         Passed - Valid encounter within last 6 months    Recent Outpatient Visits           1 year ago Primary hypertension   [place] [facility] [place], [place], PA-C   1 year ago Primary hypertension   [place] [facility] [place], [place], PA-C   1 year ago Medicare annual wellness visit, subsequent   [facility] Malva Limes, MD   1 year ago Colon cancer screening   [facility] Malva Limes, MD   1 year ago History of CVA with residual deficit   [facility] Malva Limes, MD

## 2022-09-27 ENCOUNTER — Encounter: Payer: Self-pay | Admitting: Family Medicine

## 2022-09-27 ENCOUNTER — Ambulatory Visit (INDEPENDENT_AMBULATORY_CARE_PROVIDER_SITE_OTHER): Payer: Medicare Other | Admitting: Family Medicine

## 2022-09-27 VITALS — BP 143/88 | HR 56 | Ht 71.0 in | Wt 130.5 lb

## 2022-09-27 DIAGNOSIS — Z125 Encounter for screening for malignant neoplasm of prostate: Secondary | ICD-10-CM

## 2022-09-27 DIAGNOSIS — I6930 Unspecified sequelae of cerebral infarction: Secondary | ICD-10-CM

## 2022-09-27 DIAGNOSIS — H539 Unspecified visual disturbance: Secondary | ICD-10-CM

## 2022-09-27 DIAGNOSIS — Z0000 Encounter for general adult medical examination without abnormal findings: Secondary | ICD-10-CM | POA: Diagnosis not present

## 2022-09-27 DIAGNOSIS — H6122 Impacted cerumen, left ear: Secondary | ICD-10-CM

## 2022-09-27 DIAGNOSIS — F17210 Nicotine dependence, cigarettes, uncomplicated: Secondary | ICD-10-CM

## 2022-09-27 DIAGNOSIS — I10 Essential (primary) hypertension: Secondary | ICD-10-CM

## 2022-09-28 LAB — COMPREHENSIVE METABOLIC PANEL
ALT: 14 IU/L (ref 0–44)
AST: 24 IU/L (ref 0–40)
Albumin: 4.4 g/dL (ref 3.8–4.9)
Alkaline Phosphatase: 72 IU/L (ref 44–121)
BUN/Creatinine Ratio: 12 (ref 10–24)
BUN: 12 mg/dL (ref 8–27)
Bilirubin Total: 0.4 mg/dL (ref 0.0–1.2)
CO2: 25 mmol/L (ref 20–29)
Calcium: 9.7 mg/dL (ref 8.6–10.2)
Chloride: 102 mmol/L (ref 96–106)
Creatinine, Ser: 0.97 mg/dL (ref 0.76–1.27)
Globulin, Total: 2.7 g/dL (ref 1.5–4.5)
Glucose: 78 mg/dL (ref 70–99)
Potassium: 4.8 mmol/L (ref 3.5–5.2)
Sodium: 140 mmol/L (ref 134–144)
Total Protein: 7.1 g/dL (ref 6.0–8.5)
eGFR: 89 mL/min/{1.73_m2} (ref >59)

## 2022-09-28 LAB — CBC WITH DIFFERENTIAL/PLATELET
Basophils Absolute: 0 10*3/uL (ref 0.0–0.2)
Basos: 1 %
EOS (ABSOLUTE): 0.2 10*3/uL (ref 0.0–0.4)
Eos: 3 %
Hematocrit: 42.2 % (ref 37.5–51.0)
Hemoglobin: 14.5 g/dL (ref 13.0–17.7)
Immature Grans (Abs): 0 10*3/uL (ref 0.0–0.1)
Immature Granulocytes: 0 %
Lymphocytes Absolute: 1.7 10*3/uL (ref 0.7–3.1)
Lymphs: 33 %
MCH: 29.8 pg (ref 26.6–33.0)
MCHC: 34.4 g/dL (ref 31.5–35.7)
MCV: 87 fL (ref 79–97)
Monocytes Absolute: 0.7 10*3/uL (ref 0.1–0.9)
Monocytes: 13 %
Neutrophils Absolute: 2.7 10*3/uL (ref 1.4–7.0)
Neutrophils: 50 %
Platelets: 245 10*3/uL (ref 150–450)
RBC: 4.86 x10E6/uL (ref 4.14–5.80)
RDW: 12.8 % (ref 11.6–15.4)
WBC: 5.3 10*3/uL (ref 3.4–10.8)

## 2022-09-28 LAB — PSA: Prostate Specific Ag, Serum: 1.6 ng/mL (ref 0.0–4.0)

## 2022-09-28 LAB — LIPID PANEL WITH LDL/HDL RATIO
Cholesterol, Total: 115 mg/dL (ref 100–199)
HDL: 55 mg/dL (ref >39)
LDL Chol Calc (NIH): 48 mg/dL (ref 0–99)
LDL/HDL Ratio: 0.9 ratio (ref 0.0–3.6)
Triglycerides: 51 mg/dL (ref 0–149)
VLDL Cholesterol Cal: 12 mg/dL (ref 5–40)

## 2022-09-28 LAB — TSH: TSH: 1.65 u[IU]/mL (ref 0.450–4.500)

## 2022-10-02 NOTE — Progress Notes (Signed)
Complete physical exam   Patient: Jeffery Grant   DOB: 1961-01-21   62 y.o. Male  MRN: [id number] Visit Date: 09/27/2022  Today's healthcare provider: Mila Merry, MD   Chief Complaint  Patient presents with   Annual Exam   Subjective    Discussed the use of AI scribe software for clinical note transcription with the patient, who gave verbal consent to proceed.  History of Present Illness   Jeffery Grant, a patient with a history of hypertension and hyperlipidemia, presents for a routine wellness visit and physical examination. The patient reports adherence to prescribed medications, including daily cholesterol and blood pressure medications. He denies any recent chest pain, palpitations, or shortness of breath.  The patient has a significant smoking history of approximately a pack a day, but quit about four years ago.  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 he states he was supposed to have follow up with ophthalmologist, but needs referral.   He denies any recent changes in appetite or dietary habits, and has not eaten on the day of the visit. He also denies any recent stomach problems or abnormalities.      Past Medical History:  Diagnosis Date   Benign paroxysmal positional vertigo 03/27/2009   History of chicken pox    Kidney stone    Lesion of mucosa 12/16/2014   Migraine    Personal history of COVID-19 07/19/2019   03-16-2018   Protein-calorie malnutrition, severe 02/27/2018   Stroke (HCC) 02/26/2018   Past Surgical History:  Procedure Laterality Date   BRAIN SURGERY  04/2009   Cerebellar tumor excised at [facility]    COLONOSCOPY N/A 10/29/2021   Procedure: COLONOSCOPY;  Surgeon: Midge Minium, MD;  Location: [facility];  Service: Endoscopy;  Laterality: N/A;   COLONOSCOPY WITH PROPOFOL N/A 09/17/2021   Procedure: COLONOSCOPY WITH PROPOFOL--ABORTED POOR PREP;  Surgeon: Midge Minium, MD;   Location: [facility];  Service: Endoscopy;  Laterality: N/A;  colonoscopy aborted poor prep   VARICOSE VEIN SURGERY  1990's   Social History   Socioeconomic History   Marital status: Married    Spouse name: Not on file   Number of children: 1   Years of education: Not on file   Highest education level: Not on file  Occupational History   Occupation: Merchandiser, 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[employer]    Comment: prior to 2021   Occupation: Unemployed    Comment: as of 2021  Tobacco Use   Smoking status: Former    Current packs/day: 0.00    Average packs/day: 1 pack/day for 30.0 years (30.0 ttl pk-yrs)    Types: Cigarettes    Start date: 39    Quit date: 2020    Years since quitting: 4.6   Smokeless tobacco: Never  Vaping Use   Vaping status: Never Used  Substance and Sexual Activity   Alcohol use: Not Currently    Alcohol/week: 0.0 standard drinks of alcohol    Comment: occasional use   Drug use: No   Sexual activity: Not on file  Other Topics Concern   Not on file  Social History Narrative   Not on file   Social Determinants of Health   Financial Resource Strain: Low Risk  (07/22/2022)   Overall Financial Resource Strain (CARDIA)    Difficulty of Paying Living Expenses: Not hard at all  Food Insecurity: No Food  Insecurity (07/22/2022)   Hunger Vital Sign    Worried About Running Out of Food in the Last Year: Never true    Ran Out of Food in the Last Year: Never true  Transportation Needs: No Transportation Needs (07/22/2022)   PRAPARE - Administrator, Civil Service (Medical): No    Lack of Transportation (Non-Medical): No  Physical Activity: Insufficiently Active (07/22/2022)   Exercise Vital Sign    Days of Exercise per Week: 3 days    Minutes of Exercise per Session: 30 min  Stress: No Stress Concern Present (07/22/2022)   [employer] of Occupational Health - Occupational Stress Questionnaire    Feeling of Stress : Not at all  Social  Connections: Moderately Isolated (07/22/2022)   Social Connection and Isolation Panel [NHANES]    Frequency of Communication with Friends and Family: More than three times a week    Frequency of Social Gatherings with Friends and Family: Once a week    Attends Religious Services: More than 4 times per year    Active Member of [employer] or Organizations: No    Attends Banker Meetings: Never    Marital Status: Separated  Intimate Partner Violence: Not At Risk (07/22/2022)   Humiliation, Afraid, Rape, and Kick questionnaire    Fear of Current or Ex-Partner: No    Emotionally Abused: No    Physically Abused: No    Sexually Abused: No   Family Status  Relation Name Status   Mother  Alive       Had a Brain tumor   Father  Deceased at age 78       Lung cancer   Other  (Not Specified)  No partnership data on file   Family History  Problem Relation Age of Onset   Lung cancer Father    Hypertension Other    Allergies  Allergen Reactions   Penicillins     shot form (passed out)    Patient Care Team: Malva Limes, MD as PCP - General (Family Medicine) [place], Elisha Headland, MD as Attending Physician (Neurology) Louretta Shorten, MD as Referring Physician (Cardiology)   Medications: Outpatient Medications Prior to Visit  Medication Sig   amLODipine (NORVASC) 5 MG tablet TAKE 1 TABLET (5 MG TOTAL) BY MOUTH DAILY.   aspirin 81 MG chewable tablet Chew by mouth daily.   atorvastatin (LIPITOR) 20 MG tablet Take 1 tablet (20 mg total) by mouth daily. Please schedule office visit before any future refill   lisinopril (ZESTRIL) 5 MG tablet Take 1 tablet (5 mg total) by mouth daily. (Patient not taking: Reported on 09/11/2021)   No facility-administered medications prior to visit.    Review of Systems  Constitutional:  Negative for chills, diaphoresis and fever.  HENT:  Negative for congestion, ear discharge, ear pain, hearing loss, nosebleeds, sore throat and tinnitus.    Eyes:  Negative for photophobia, pain, discharge and redness.  Respiratory:  Negative for cough, shortness of breath, wheezing and stridor.   Cardiovascular:  Negative for chest pain, palpitations and leg swelling.  Gastrointestinal:  Negative for abdominal pain, blood in stool, constipation, diarrhea, nausea and vomiting.  Endocrine: Negative for polydipsia.  Genitourinary:  Negative for dysuria, flank pain, frequency, hematuria and urgency.  Musculoskeletal:  Negative for back pain, myalgias and neck pain.  Skin:  Negative for rash.  Allergic/Immunologic: Negative for environmental allergies.  Neurological:  Negative for dizziness, tremors, seizures, weakness and headaches.  Hematological:  Does not  bruise/bleed easily.  Psychiatric/Behavioral:  Negative for hallucinations and suicidal ideas. The patient is not nervous/anxious.       Objective    BP (!) 143/88 (BP Location: Right Arm, Patient Position: Sitting, Cuff Size: Normal)   Pulse (!) 56   Ht 5\' 11"  (1.803 m)   Wt 130 lb 8 oz (59.2 kg)   BMI 18.20 kg/m    Physical Exam   General Appearance:    Thin male. Alert, cooperative, in no acute distress, appears stated age  Head:    Normocephalic, without obvious abnormality, atraumatic  Eyes:    PERRL, conjunctiva/corneas clear, EOM's intact, fundi    benign, both eyes  Poor vision of left eye.  Ears:    Normal TM right. Excessive cerumen of left, unable to visualized left TM  Nose:   Nares normal, septum midline, mucosa normal, no drainage   or sinus tenderness  Throat:   Lips, mucosa, and tongue normal; teeth and gums normal  Neck:   Supple, symmetrical, trachea midline, no adenopathy;       thyroid:  No enlargement/tenderness/nodules; no carotid   bruit or JVD  Back:     Symmetric, no curvature, ROM normal, no CVA tenderness  Lungs:     Clear to auscultation bilaterally, respirations unlabored  Chest wall:    No tenderness or deformity  Heart:    Bradycardic. Normal  rhythm. No murmurs, rubs, or gallops.  S1 and S2 normal  Abdomen:     Soft, non-tender, bowel sounds active all four quadrants,    no masses, no organomegaly  Genitalia:    deferred  Rectal:    deferred  Extremities:   All extremities are intact. No cyanosis or edema  Pulses:   2+ and symmetric all extremities  Skin:   Skin color, texture, turgor normal, no rashes or lesions  Lymph nodes:   Cervical, supraclavicular, and axillary nodes normal  Neurologic:   CNII-XII intact. Normal strength, sensation and reflexes      throughout      Assessment & Plan    Assessment and Plan  Annual complete physical exam.     Hypertension and Hyperlipidemia No reported issues with medications or symptoms suggestive of cardiovascular disease. -Continue current medications.  Smoking Cessation Quit smoking four years ago, previously smoked a pack a day. -Order low-dose CT scan for lung cancer screening due to history of heavy smoking.  Cerumen Impaction Left ear clogged with soft wax, no hearing impairment reported. -Advise use of over-the-counter ear drops to soften and remove wax.  Visual Impairment History of stroke with resultant vision loss in left eye. -Refer to ophthalmology for evaluation and potential retinal specialist referral.  General Health Maintenance -Perform EKG as part of annual wellness visit. -Advise patient to receive flu shot at local pharmacy. -Order routine blood work.       He declined recommended flu vaccine   Mila Merry, MD  [facility] [phone] (phone) [phone] (fax)  [facility]

## 2022-10-02 NOTE — Progress Notes (Signed)
Annual Wellness Visit     Patient: Jeffery Grant, Male    DOB: [birth year], 62 y.o.   MRN: [id number] Visit Date: 09/27/2022  Today's Provider: Mila Merry, MD    Subjective    Jeffery Grant is a 62 y.o. male who presents today for his Annual Wellness Visit.   Medications: Outpatient Medications Prior to Visit  Medication Sig   amLODipine (NORVASC) 5 MG tablet TAKE 1 TABLET (5 MG TOTAL) BY MOUTH DAILY.   aspirin 81 MG chewable tablet Chew by mouth daily.   atorvastatin (LIPITOR) 20 MG tablet Take 1 tablet (20 mg total) by mouth daily. Please schedule office visit before any future refill   lisinopril (ZESTRIL) 5 MG tablet Take 1 tablet (5 mg total) by mouth daily. (Patient not taking: Reported on 09/11/2021)   No facility-administered medications prior to visit.    Allergies  Allergen Reactions   Penicillins     shot form (passed out)    Patient Care Team: Malva Limes, MD as PCP - General (Family Medicine) [place], Elisha Headland, MD as Attending Physician (Neurology) Louretta Shorten, MD as Referring Physician (Cardiology)      Objective      Most recent functional status assessment:    07/22/2022    8:38 AM  In your present state of health, do you have any difficulty performing the following activities:  Hearing? 0  Vision? 1  Difficulty concentrating or making decisions? 0  Walking or climbing stairs? 0  Dressing or bathing? 0  Doing errands, shopping? 0  Preparing Food and eating ? N  Using the Toilet? N  In the past six months, have you accidently leaked urine? N  Do you have problems with loss of bowel control? N  Managing your Medications? N  Managing your Finances? N  Housekeeping or managing your Housekeeping? N   Most recent fall risk assessment:    09/27/2022    1:59 PM  Fall Risk   Falls in the past year? 0  Injury with Fall? 0  Risk for fall due to : No Fall Risks  Follow up Falls evaluation completed    Most recent  depression screenings:    09/27/2022    1:59 PM 07/22/2022    8:35 AM  PHQ 2/9 Scores  PHQ - 2 Score 0 0   Most recent cognitive screening:    07/22/2022    8:56 AM  6CIT Screen  What Year? 0 points  What month? 0 points  What time? 0 points  Count back from 20 0 points  Months in reverse 2 points  Repeat phrase 0 points  Total Score 2 points   Most recent Audit-C alcohol use screening    07/22/2022    8:35 AM  Alcohol Use Disorder Test (AUDIT)  1. How often do you have a drink containing alcohol? 0  2. How many drinks containing alcohol do you have on a typical day when you are drinking? 0  3. How often do you have six or more drinks on one occasion? 0  AUDIT-C Score 0   A score of 3 or more in women, and 4 or more in men indicates increased risk for alcohol abuse, EXCEPT if all of the points are from question 1     Assessment & Plan     Annual wellness visit done today including the all of the following: Reviewed patient's Family Medical History Reviewed and updated list of patient's medical  providers Assessment of cognitive impairment was done Assessed patient's functional ability Established a written schedule for health screening services Health Risk Assessent Completed and Reviewed  Exercise Activities and Dietary recommendations  Goals   None     Immunization History  Administered Date(s) Administered   Influenza,inj,Quad PF,6+ Mos 03/27/2018   Tdap 07/14/2009    Health Maintenance  Topic Date Due   Lung Cancer Screening  Never done   Zoster Vaccines- Shingrix (1 of 2) Never done   DTaP/Tdap/Td (2 - Td or Tdap) 07/15/2019   COVID-19 Vaccine (1 - 2023-24 season) Never done   INFLUENZA VACCINE  05/12/2023 (Originally 09/12/2022)   Medicare Annual Wellness (AWV)  09/27/2023   Colonoscopy  10/29/2028   Hepatitis C Screening  Completed   HIV Screening  Completed   HPV VACCINES  Aged Out     Discussed health benefits of physical activity, and  encouraged him to engage in regular exercise appropriate for his age and condition.            Mila Merry, MD  [facility] [phone] (phone) [phone] (fax)  [facility]

## 2022-10-21 DIAGNOSIS — H5203 Hypermetropia, bilateral: Secondary | ICD-10-CM | POA: Diagnosis not present

## 2022-10-21 DIAGNOSIS — H52223 Regular astigmatism, bilateral: Secondary | ICD-10-CM | POA: Diagnosis not present

## 2022-10-21 DIAGNOSIS — H2513 Age-related nuclear cataract, bilateral: Secondary | ICD-10-CM | POA: Diagnosis not present

## 2022-10-21 DIAGNOSIS — H524 Presbyopia: Secondary | ICD-10-CM | POA: Diagnosis not present

## 2022-10-21 DIAGNOSIS — H53462 Homonymous bilateral field defects, left side: Secondary | ICD-10-CM | POA: Diagnosis not present

## 2022-11-09 ENCOUNTER — Other Ambulatory Visit: Payer: Self-pay | Admitting: Family Medicine

## 2022-11-09 DIAGNOSIS — H547 Unspecified visual loss: Secondary | ICD-10-CM

## 2022-11-11 NOTE — Telephone Encounter (Signed)
Requested Prescriptions  Pending Prescriptions Disp Refills   atorvastatin (LIPITOR) 20 MG tablet [Pharmacy Med Name: ATORVASTATIN 20 MG TABLET] 90 tablet 2    Sig: Take 1 tablet (20 mg total) by mouth daily.     Cardiovascular:  Antilipid - Statins Failed - 11/09/2022  8:46 AM      Failed - Lipid Panel in normal range within the last 12 months    Cholesterol, Total  Date Value Ref Range Status  09/27/2022 115 100 - 199 mg/dL Final   LDL Chol Calc (NIH)  Date Value Ref Range Status  09/27/2022 48 0 - 99 mg/dL Final   HDL  Date Value Ref Range Status  09/27/2022 55 >39 mg/dL Final   Triglycerides  Date Value Ref Range Status  09/27/2022 51 0 - 149 mg/dL Final         Passed - Patient is not pregnant      Passed - Valid encounter within last 12 months    Recent Outpatient Visits           1 month ago Annual physical exam   [place] [facility] Malva Limes, MD   1 year ago Primary hypertension   [place] [facility] [place], [place], PA-C   1 year ago Primary hypertension   [place] [facility] [place], [place], PA-C   1 year ago Medicare annual wellness visit, subsequent   [facility] Malva Limes, MD   1 year ago Colon cancer screening   [facility] Malva Limes, MD       Future Appointments             In 4 months Fisher, Demetrios Isaacs, MD [facility]

## 2023-04-07 ENCOUNTER — Ambulatory Visit (INDEPENDENT_AMBULATORY_CARE_PROVIDER_SITE_OTHER): Payer: Medicare Other | Admitting: Family Medicine

## 2023-04-07 ENCOUNTER — Encounter: Payer: Self-pay | Admitting: Family Medicine

## 2023-04-07 VITALS — BP 130/78 | HR 61 | Resp 16 | Ht 71.0 in | Wt 136.8 lb

## 2023-04-07 DIAGNOSIS — I6930 Unspecified sequelae of cerebral infarction: Secondary | ICD-10-CM | POA: Diagnosis not present

## 2023-04-07 DIAGNOSIS — I69391 Dysphagia following cerebral infarction: Secondary | ICD-10-CM

## 2023-04-07 DIAGNOSIS — I10 Essential (primary) hypertension: Secondary | ICD-10-CM | POA: Diagnosis not present

## 2023-04-07 NOTE — Patient Instructions (Signed)
 Marland Kitchen  Please review the attached list of medications and notify my office if there are any errors.   . Please bring all of your medications to every appointment so we can make sure that our medication list is the same as yours.

## 2023-04-14 NOTE — Progress Notes (Signed)
 Established patient visit   Patient: Jeffery Grant   DOB: March 11, 1960   63 y.o. Male  MRN: [id number] Visit Date: 04/07/2023  Today's healthcare provider: Mila Merry, MD   Chief Complaint  Patient presents with   Medical Management of Chronic Issues    6 months follow up HTN   Hypertension   Subjective    Discussed the use of AI scribe software for clinical note transcription with the patient, who gave verbal consent to proceed.  History of Present Illness   Jeffery Grant is a 63 year old male with hypertension and hyperlipidemia who presents for a routine follow-up.  He feels well and has not experienced any issues with his medications. He is currently taking amlodipine for his blood pressure. He monitors his blood pressure at home, with readings typically around 127/80 mmHg, which is consistent with his target range of 120-130 mmHg. No lightheadedness, dizziness, chest pain, or swelling in his feet or ankles.  He mentions numbness and tingling in his left hand, particularly when lying down at night, which causes aching. This symptom is less bothersome during the day when he is up and moving around. He attributes this to a previous stroke, although this is not confirmed in the conversation.  Lab Results  Component Value Date   NA 140 09/27/2022   K 4.8 09/27/2022   CREATININE 0.97 09/27/2022   EGFR 89 09/27/2022   GLUCOSE 78 09/27/2022   Lab Results  Component Value Date   CHOL 115 09/27/2022   HDL 55 09/27/2022   LDLCALC 48 09/27/2022   TRIG 51 09/27/2022   CHOLHDL 1.7 04/17/2021         Medications: Outpatient Medications Prior to Visit  Medication Sig Note   amLODipine (NORVASC) 5 MG tablet TAKE 1 TABLET (5 MG TOTAL) BY MOUTH DAILY.    aspirin 81 MG chewable tablet Chew by mouth daily.    atorvastatin (LIPITOR) 20 MG tablet Take 1 tablet (20 mg total) by mouth daily.    No facility-administered medications prior to visit.   Review of Systems   Constitutional:  Negative for appetite change, chills and fever.  Respiratory:  Negative for chest tightness, shortness of breath and wheezing.   Cardiovascular:  Negative for chest pain and palpitations.  Gastrointestinal:  Negative for abdominal pain, nausea and vomiting.       Objective    BP 130/78   Pulse 61   Resp 16   Ht 5\' 11"  (1.803 m)   Wt 136 lb 12.8 oz (62.1 kg)   BMI 19.08 kg/m   Physical Exam   General: Appearance:    Thin male in no acute distress  Eyes:    PERRL, conjunctiva/corneas clear, EOM's intact       Lungs:     Clear to auscultation bilaterally, respirations unlabored  Heart:    Normal heart rate. Normal rhythm. No murmurs, rubs, or gallops.    MS:   All extremities are intact.    Neurologic:   Awake, alert, oriented x 3. No apparent focal neurological defect.         Assessment & Plan       Hypertension & Hyperlipidemia Well controlled on Amlodipine with home readings around 127/80. No symptoms of hypotension or end-organ damage. -Continue Amlodipine. -Check blood pressure at home and report any significant changes.  Hand discomfort and altered use Likely secondary to previous stroke. No acute changes or new neurological symptoms. -Encourage use of hand  exercises to improve function and reduce discomfort.  General Health Maintenance -Ensure medication refills are sufficient until next appointment. -Schedule annual physical for August 2025, with routine blood work including cholesterol check.    Return in about 6 months (around 10/05/2023) for Yearly Physical.      Mila Merry, MD  [facility] [phone] (phone) [phone] (fax)  [facility]

## 2023-07-23 ENCOUNTER — Ambulatory Visit (INDEPENDENT_AMBULATORY_CARE_PROVIDER_SITE_OTHER): Payer: Medicare Other

## 2023-07-23 DIAGNOSIS — Z0000 Encounter for general adult medical examination without abnormal findings: Secondary | ICD-10-CM

## 2023-07-23 NOTE — Patient Instructions (Addendum)
 Jeffery Grant , Thank you for taking time out of your busy schedule to complete your Annual Wellness Visit with me. I enjoyed our conversation and look forward to speaking with you again next year. I, as well as your care team,  appreciate your ongoing commitment to your health goals. Please review the following plan we discussed and let me know if I can assist you in the future.   Follow up Visits: Next Medicare AWV with our clinical staff:   08/03/24 @ 8:10 AM BY PHONE Have you seen your provider in the last 6 months (3 months if uncontrolled diabetes)? Yes  Clinician Recommendations:  Aim for 30 minutes of exercise or brisk walking, 6-8 glasses of water , and 5 servings of fruits and vegetables each day. TAKE CARE!      This is a list of the screening recommended for you and due dates:  Health Maintenance  Topic Date Due   Screening for Lung Cancer  Never done   Zoster (Shingles) Vaccine (1 of 2) Never done   DTaP/Tdap/Td vaccine (2 - Td or Tdap) 07/15/2019   COVID-19 Vaccine (1 - 2024-25 season) Never done   Flu Shot  09/12/2023   Medicare Annual Wellness Visit  07/22/2024   Colon Cancer Screening  10/29/2028   Hepatitis C Screening  Completed   HIV Screening  Completed   HPV Vaccine  Aged Out   Meningitis B Vaccine  Aged Out    Advanced directives: (ACP Link)Information on Advanced Care Planning can be found at [place]  Best boy Advance Health Care Directives Advance Health Care Directives. [email or web address]  Advance Care Planning is important because it:  [x]  Makes sure you receive the medical care that is consistent with your values, goals, and preferences  [x]  It provides guidance to your family and loved ones and reduces their decisional burden about whether or not they are making the right decisions based on your wishes.  Follow the link provided in your after visit summary or read over the paperwork we have mailed to you to help you started getting your Advance  Directives in place. If you need assistance in completing these, please reach out to us  so that we can help you!

## 2023-07-23 NOTE — Progress Notes (Signed)
 Subjective:   Jeffery Grant is a 63 y.o. who presents for a Medicare Wellness preventive visit.  As a reminder, Annual Wellness Visits don't include a physical exam, and some assessments may be limited, especially if this visit is performed virtually. We may recommend an in-person follow-up visit with your provider if needed.  Visit Complete: Virtual I connected with  Val Garin on 07/23/23 by a audio enabled telemedicine application and verified that I am speaking with the correct person using two identifiers.  Patient Location: Home  Provider Location: Home Office  I discussed the limitations of evaluation and management by telemedicine. The patient expressed understanding and agreed to proceed.  Vital Signs: Because this visit was a virtual/telehealth visit, some criteria may be missing or patient reported. Any vitals not documented were not able to be obtained and vitals that have been documented are patient reported.  VideoDeclined- This patient declined Librarian, academic. Therefore the visit was completed with audio only.  Persons Participating in Visit: Patient.  AWV Questionnaire: No: Patient Medicare AWV questionnaire was not completed prior to this visit.  Cardiac Risk Factors include: advanced age (>62men, >72 women);male gender;hypertension     Objective:     There were no vitals filed for this visit. There is no height or weight on file to calculate BMI.     07/23/2023    8:15 AM 07/22/2022    9:11 AM 10/29/2021    9:17 AM 09/17/2021    8:42 AM 03/17/2019    4:13 PM 02/26/2018   11:29 AM 12/20/2015    6:07 PM  Advanced Directives  Does Patient Have a Medical Advance Directive? No Yes Yes No No No No  Type of Science writer of [place];Living will      Copy of Healthcare Power of Attorney in Chart?   No - copy requested      Would patient like information on creating a medical  advance directive? No - Patient declined   No - Patient declined  No - Patient declined No - patient declined information    Current Medications (verified) Outpatient Encounter Medications as of 07/23/2023  Medication Sig   amLODipine  (NORVASC ) 5 MG tablet TAKE 1 TABLET (5 MG TOTAL) BY MOUTH DAILY.   aspirin  81 MG chewable tablet Chew by mouth daily.   atorvastatin  (LIPITOR) 20 MG tablet Take 1 tablet (20 mg total) by mouth daily.   No facility-administered encounter medications on file as of 07/23/2023.    Allergies (verified) Penicillins   History: Past Medical History:  Diagnosis Date   Benign paroxysmal positional vertigo 03/27/2009   History of chicken pox    Kidney stone    Lesion of mucosa 12/16/2014   Migraine    Personal history of COVID-19 07/19/2019   03-16-2018   Protein-calorie malnutrition, severe 02/27/2018   Stroke (HCC) 02/26/2018   Past Surgical History:  Procedure Laterality Date   BRAIN SURGERY  04/2009   Cerebellar tumor excised at [facility]    COLONOSCOPY N/A 10/29/2021   Procedure: COLONOSCOPY;  Surgeon: Marnee Sink, MD;  Location: [facility];  Service: Endoscopy;  Laterality: N/A;   COLONOSCOPY WITH PROPOFOL  N/A 09/17/2021   Procedure: COLONOSCOPY WITH PROPOFOL --ABORTED POOR PREP;  Surgeon: Marnee Sink, MD;  Location: [facility];  Service: Endoscopy;  Laterality: N/A;  colonoscopy aborted poor prep   VARICOSE VEIN SURGERY  1990's   Family History  Problem Relation Age of Onset  Lung cancer Father    Hypertension Other    Social History   Socioeconomic History   Marital status: Married    Spouse name: Not on file   Number of children: 1   Years of education: Not on file   Highest education level: Not on file  Occupational History   Occupation: Merchandiser, 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[employer]    Comment: prior to 2021   Occupation: Unemployed    Comment: as of 2021  Tobacco Use   Smoking status: Former    Current packs/day: 0.00     Average packs/day: 1 pack/day for 30.0 years (30.0 ttl pk-yrs)    Types: Cigarettes    Start date: 23    Quit date: 2020    Years since quitting: 5.4   Smokeless tobacco: Never  Vaping Use   Vaping status: Never Used  Substance and Sexual Activity   Alcohol use: Not Currently    Alcohol/week: 0.0 standard drinks of alcohol    Comment: occasional use   Drug use: No   Sexual activity: Not on file  Other Topics Concern   Not on file  Social History Narrative   Not on file   Social Drivers of Health   Financial Resource Strain: Low Risk  (07/23/2023)   Overall Financial Resource Strain (CARDIA)    Difficulty of Paying Living Expenses: Not hard at all  Food Insecurity: No Food Insecurity (07/23/2023)   Hunger Vital Sign    Worried About Running Out of Food in the Last Year: Never true    Ran Out of Food in the Last Year: Never true  Transportation Needs: No Transportation Needs (07/23/2023)   PRAPARE - Administrator, Civil Service (Medical): No    Lack of Transportation (Non-Medical): No  Physical Activity: Insufficiently Active (07/23/2023)   Exercise Vital Sign    Days of Exercise per Week: 3 days    Minutes of Exercise per Session: 30 min  Stress: No Stress Concern Present (07/23/2023)   [employer] of Occupational Health - Occupational Stress Questionnaire    Feeling of Stress : Not at all  Social Connections: Moderately Isolated (07/23/2023)   Social Connection and Isolation Panel [NHANES]    Frequency of Communication with Friends and Family: More than three times a week    Frequency of Social Gatherings with Friends and Family: Twice a week    Attends Religious Services: More than 4 times per year    Active Member of [employer] or Organizations: No    Attends Banker Meetings: Never    Marital Status: Separated    Tobacco Counseling Counseling given: Not Answered    Clinical Intake:  Pre-visit preparation completed: Yes  Pain :  No/denies pain     BMI - recorded: 19 Nutritional Status: BMI of 19-24  Normal Nutritional Risks: None Diabetes: No  No results found for: HGBA1C   How often do you need to have someone help you when you read instructions, pamphlets, or other written materials from your doctor or pharmacy?: 1 - Never  Interpreter Needed?: No  Information entered by :: Dellie Fergusson, LPN   Activities of Daily Living    07/23/2023    8:17 AM  In your present state of health, do you have any difficulty performing the following activities:  Hearing? 0  Vision? 1  Difficulty concentrating or making decisions? 0  Walking or climbing stairs? 0  Dressing or bathing? 0  Doing errands, shopping? 1  Preparing Food and eating ? N  Using the Toilet? N  In the past six months, have you accidently leaked urine? N  Do you have problems with loss of bowel control? N  Managing your Medications? N  Managing your Finances? N  Housekeeping or managing your Housekeeping? N    Patient Care Team: Lamon Pillow, MD as PCP - General (Family Medicine) W[place], Loralee Roche, MD as Attending Physician (Neurology) Patel, Amit Vipin, MD as Referring Physician (Cardiology)  I have updated your Care Teams any recent Medical Services you may have received from other providers in the past year.     Assessment:    This is a routine wellness examination for Jeffery Grant.  Hearing/Vision screen Hearing Screening - Comments:: NO AIDS Vision Screening - Comments:: WEARS GLASSES ALL DAY- [facility] - LEFT EYE PARTIALLY BLIND   Goals Addressed             This Visit's Progress    DIET - EAT MORE FRUITS AND VEGETABLES         Depression Screen     07/23/2023    8:13 AM 04/07/2023    8:56 AM 09/27/2022    1:59 PM 07/22/2022    8:35 AM 07/17/2021    9:12 AM 03/13/2021   10:45 AM 07/19/2019    9:01 AM  PHQ 2/9 Scores  PHQ - 2 Score 0 1 0 0 0 2 0  PHQ- 9 Score 0 4   0 4     Fall Risk     07/23/2023     8:17 AM 04/07/2023    8:56 AM 09/27/2022    1:59 PM 07/22/2022    8:32 AM 07/17/2021    9:11 AM  Fall Risk   Falls in the past year? 0 0 0 0 0  Number falls in past yr: 0 0  0 0  Injury with Fall? 0 0 0  0  Risk for fall due to : No Fall Risks No Fall Risks No Fall Risks No Fall Risks   Follow up Falls evaluation completed  Falls evaluation completed Education provided;Falls prevention discussed Falls evaluation completed    MEDICARE RISK AT HOME:  Medicare Risk at Home Any stairs in or around the home?: Yes If so, are there any without handrails?: Yes Home free of loose throw rugs in walkways, pet beds, electrical cords, etc?: Yes Adequate lighting in your home to reduce risk of falls?: Yes Life alert?: No Use of a cane, walker or w/c?: No Grab bars in the bathroom?: No Shower chair or bench in shower?: No Elevated toilet seat or a handicapped toilet?: No  TIMED UP AND GO:  Was the test performed?  No  Cognitive Function: 6CIT completed        07/23/2023    8:19 AM 07/22/2022    8:56 AM  6CIT Screen  What Year? 0 points 0 points  What month? 0 points 0 points  What time? 0 points 0 points  Count back from 20 0 points 0 points  Months in reverse 0 points 2 points  Repeat phrase 6 points 0 points  Total Score 6 points 2 points    Immunizations Immunization History  Administered Date(s) Administered   Influenza,inj,Quad PF,6+ Mos 03/27/2018   Tdap 07/14/2009    Screening Tests Health Maintenance  Topic Date Due   Lung Cancer Screening  Never done   Zoster Vaccines- Shingrix (1 of 2) Never done   DTaP/Tdap/Td (2 - Td  or Tdap) 07/15/2019   COVID-19 Vaccine (1 - 2024-25 season) Never done   INFLUENZA VACCINE  09/12/2023   Medicare Annual Wellness (AWV)  07/22/2024   Colonoscopy  10/29/2028   Hepatitis C Screening  Completed   HIV Screening  Completed   HPV VACCINES  Aged Out   Meningococcal B Vaccine  Aged Out    Health Maintenance  Health Maintenance Due   Topic Date Due   Lung Cancer Screening  Never done   Zoster Vaccines- Shingrix (1 of 2) Never done   DTaP/Tdap/Td (2 - Td or Tdap) 07/15/2019   COVID-19 Vaccine (1 - 2024-25 season) Never done   Health Maintenance Items Addressed: UP TO DATE ON COLONOSCOPY; DECLINES SHOTS; DECLINES LUNG CA SCREENING  Additional Screening:  Vision Screening: Recommended annual ophthalmology exams for early detection of glaucoma and other disorders of the eye. Would you like a referral to an eye doctor? No    Dental Screening: Recommended annual dental exams for proper oral hygiene  Community Resource Referral / Chronic Care Management: CRR required this visit?  No   CCM required this visit?  No   Plan:    I have personally reviewed and noted the following in the patient's chart:   Medical and social history Use of alcohol, tobacco or illicit drugs  Current medications and supplements including opioid prescriptions. Patient is not currently taking opioid prescriptions. Functional ability and status Nutritional status Physical activity Advanced directives List of other physicians Hospitalizations, surgeries, and ER visits in previous 12 months Vitals Screenings to include cognitive, depression, and falls Referrals and appointments  In addition, I have reviewed and discussed with patient certain preventive protocols, quality metrics, and best practice recommendations. A written personalized care plan for preventive services as well as general preventive health recommendations were provided to patient.   Pinky Bright, LPN   9/60/4540   After Visit Summary: (MyChart) Due to this being a telephonic visit, the after visit summary with patients personalized plan was offered to patient via MyChart   Notes: Nothing significant to report at this time.

## 2023-07-29 ENCOUNTER — Telehealth: Payer: Self-pay | Admitting: Family Medicine

## 2023-07-29 NOTE — Telephone Encounter (Signed)
 Patient's sister, Joyceann No, brought 2 [employer] forms by to have Dr. Shann Darnel complete them.  Please fax when done and call Joyceann No to pick up a copy of the forms please.

## 2023-08-05 ENCOUNTER — Telehealth: Payer: Self-pay | Admitting: Family Medicine

## 2023-08-05 NOTE — Telephone Encounter (Signed)
 Spoke with pt sister about [employer] paper work has been completed & faxed & will pick it up on 08/08/23.

## 2023-08-10 ENCOUNTER — Other Ambulatory Visit: Payer: Self-pay | Admitting: Family Medicine

## 2023-08-10 DIAGNOSIS — H547 Unspecified visual loss: Secondary | ICD-10-CM

## 2023-08-10 NOTE — Telephone Encounter (Signed)
 I have not seen these forms.

## 2023-10-06 ENCOUNTER — Ambulatory Visit (INDEPENDENT_AMBULATORY_CARE_PROVIDER_SITE_OTHER): Payer: Medicare Other | Admitting: Family Medicine

## 2023-10-06 VITALS — BP 146/88 | HR 59 | Ht 71.0 in | Wt 138.0 lb

## 2023-10-06 DIAGNOSIS — F17210 Nicotine dependence, cigarettes, uncomplicated: Secondary | ICD-10-CM

## 2023-10-06 DIAGNOSIS — Z87891 Personal history of nicotine dependence: Secondary | ICD-10-CM | POA: Diagnosis not present

## 2023-10-06 DIAGNOSIS — Z0000 Encounter for general adult medical examination without abnormal findings: Secondary | ICD-10-CM | POA: Diagnosis not present

## 2023-10-06 DIAGNOSIS — Z125 Encounter for screening for malignant neoplasm of prostate: Secondary | ICD-10-CM

## 2023-10-06 DIAGNOSIS — I10 Essential (primary) hypertension: Secondary | ICD-10-CM | POA: Diagnosis not present

## 2023-10-06 DIAGNOSIS — I6930 Unspecified sequelae of cerebral infarction: Secondary | ICD-10-CM | POA: Diagnosis not present

## 2023-10-06 DIAGNOSIS — D496 Neoplasm of unspecified behavior of brain: Secondary | ICD-10-CM

## 2023-10-06 MED ORDER — AMLODIPINE BESYLATE 5 MG PO TABS: 5.0000 mg | ORAL_TABLET | Freq: Every day | ORAL | 4 refills | Status: AC

## 2023-10-06 NOTE — Patient Instructions (Signed)
 Please review the attached list of medications and notify my office if there are any errors.   You can go to any [facility] to get your blood drawn. There is one at [facility] on [address] across from [address]. There is one on [address] behind [address], and one at  [address] near the [address]

## 2023-10-13 DIAGNOSIS — Z87891 Personal history of nicotine dependence: Secondary | ICD-10-CM | POA: Insufficient documentation

## 2023-10-13 NOTE — Progress Notes (Signed)
 Complete physical exam   Patient: Jeffery Grant   DOB: 31-Oct-1960   63 y.o. Male  MRN: [id number] Visit Date: 10/06/2023  Today's healthcare provider: Nancyann Perry, MD   Chief Complaint  Patient presents with   Annual Exam    Feels well, sleeping well and eating well.  He does not exercise.  He does not have any concerns today   Subjective    Discussed the use of AI scribe software for clinical note transcription with the patient, who gave verbal consent to proceed.  History of Present Illness   Jeffery Grant is a 62 year old male with a history of CVA who presents for an annual physical exam.  He is doing well with no specific complaints. He is currently taking amlodipine  and atorvastatin  without any issues. He takes his blood pressure medication every evening around 3 PM to 4 PM.  He monitors his blood pressure at home, typically recording readings around 115/117 mmHg using an arm cuff.  No chest pain, heart flutters, or shortness of breath. No stomach problems, cramping, or changes in bowel habits.  He quit smoking five years ago after smoking about a pack a day. He does not consume alcohol.  He has not seen an eye doctor in the last year or two but confirms having one. He had a colonoscopy five years ago.         Past Medical History:  Diagnosis Date   Benign paroxysmal positional vertigo 03/27/2009   History of chicken pox    Kidney stone    Lesion of mucosa 12/16/2014   Migraine    Personal history of COVID-19 07/19/2019   03-16-2018   Protein-calorie malnutrition, severe 02/27/2018   Stroke (HCC) 02/26/2018   Past Surgical History:  Procedure Laterality Date   BRAIN SURGERY  04/2009   Cerebellar tumor excised at [facility]    COLONOSCOPY N/A 10/29/2021   Procedure: COLONOSCOPY;  Surgeon: Jinny Carmine, MD;  Location: [facility];  Service: Endoscopy;  Laterality: N/A;   COLONOSCOPY WITH PROPOFOL  N/A 09/17/2021   Procedure: COLONOSCOPY WITH  PROPOFOL --ABORTED POOR PREP;  Surgeon: Jinny Carmine, MD;  Location: [facility];  Service: Endoscopy;  Laterality: N/A;  colonoscopy aborted poor prep   VARICOSE VEIN SURGERY  1990's   Social History   Socioeconomic History   Marital status: Legally Separated    Spouse name: Not on file   Number of children: 1   Years of education: Not on file   Highest education level: Not on file  Occupational History   Occupation: Merchandiser, 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[employer]    Comment: prior to 2021   Occupation: Unemployed    Comment: as of 2021  Tobacco Use   Smoking status: Former    Current packs/day: 0.00    Average packs/day: 1 pack/day for 30.0 years (30.0 ttl pk-yrs)    Types: Cigarettes    Start date: 89    Quit date: 2020    Years since quitting: 5.6   Smokeless tobacco: Never  Vaping Use   Vaping status: Never Used  Substance and Sexual Activity   Alcohol use: Not Currently    Alcohol/week: 0.0 standard drinks of alcohol    Comment: occasional use   Drug use: No   Sexual activity: Not on file  Other Topics Concern   Not on file  Social History Narrative   Not on file   Social Drivers of Corporate investment banker  Strain: Low Risk  (07/23/2023)   Overall Financial Resource Strain (CARDIA)    Difficulty of Paying Living Expenses: Not hard at all  Food Insecurity: No Food Insecurity (07/23/2023)   Hunger Vital Sign    Worried About Running Out of Food in the Last Year: Never true    Ran Out of Food in the Last Year: Never true  Transportation Needs: No Transportation Needs (07/23/2023)   PRAPARE - Administrator, Civil Service (Medical): No    Lack of Transportation (Non-Medical): No  Physical Activity: Insufficiently Active (07/23/2023)   Exercise Vital Sign    Days of Exercise per Week: 3 days    Minutes of Exercise per Session: 30 min  Stress: No Stress Concern Present (07/23/2023)   [employer] of Occupational Health - Occupational  Stress Questionnaire    Feeling of Stress : Not at all  Social Connections: Moderately Isolated (07/23/2023)   Social Connection and Isolation Panel    Frequency of Communication with Friends and Family: More than three times a week    Frequency of Social Gatherings with Friends and Family: Twice a week    Attends Religious Services: More than 4 times per year    Active Member of [employer] or Organizations: No    Attends Banker Meetings: Never    Marital Status: Separated  Intimate Partner Violence: Not At Risk (07/23/2023)   Humiliation, Afraid, Rape, and Kick questionnaire    Fear of Current or Ex-Partner: No    Emotionally Abused: No    Physically Abused: No    Sexually Abused: No   Family Status  Relation Name Status   Mother  Alive       Had a Brain tumor   Father  Deceased at age 9       Lung cancer   Other  (Not Specified)  No partnership data on file   Family History  Problem Relation Age of Onset   Lung cancer Father    Hypertension Other    Allergies  Allergen Reactions   Penicillins     shot form (passed out)    Patient Care Team: Gasper Nancyann BRAVO, MD as PCP - General (Family Medicine) Stacy, Iantha Muse, MD as Attending Physician (Neurology) Patel, Amit Vipin, MD as Referring Physician (Cardiology)   Medications: Outpatient Medications Prior to Visit  Medication Sig   aspirin  81 MG chewable tablet Chew by mouth daily.   atorvastatin  (LIPITOR) 20 MG tablet TAKE 1 TABLET BY MOUTH EVERY DAY   amLODipine  (NORVASC ) 5 MG tablet TAKE 1 TABLET (5 MG TOTAL) BY MOUTH DAILY.   No facility-administered medications prior to visit.    Review of Systems  Constitutional:  Negative for appetite change, chills and fever.  Respiratory:  Negative for chest tightness, shortness of breath and wheezing.   Cardiovascular:  Negative for chest pain and palpitations.  Gastrointestinal:  Negative for abdominal pain, nausea and vomiting.      Objective    BP  (!) 154/89   Pulse (!) 59   Ht 5' 11 (1.803 m)   Wt 138 lb (62.6 kg)   SpO2 100%   BMI 19.25 kg/m    Physical Exam  General Appearance:    Thin male. Alert, cooperative, in no acute distress, appears stated age  Head:    Normocephalic, without obvious abnormality, atraumatic  Eyes:    PERRL, conjunctiva/corneas clear, EOM's intact, fundi    benign, both eyes  Ears:    Normal TM's and external ear canals, both ears  Nose:   Nares normal, septum midline, mucosa normal, no drainage   or sinus tenderness  Throat:   Lips, mucosa, and tongue normal; teeth and gums normal  Neck:   Supple, symmetrical, trachea midline, no adenopathy;       thyroid:  No enlargement/tenderness/nodules; no carotid   bruit or JVD  Back:     Symmetric, no curvature, ROM normal, no CVA tenderness  Lungs:     Clear to auscultation bilaterally, respirations unlabored  Chest wall:    No tenderness or deformity  Heart:    Bradycardic. Normal rhythm. No murmurs, rubs, or gallops.  S1 and S2 normal  Abdomen:     Soft, non-tender, bowel sounds active all four quadrants,    no masses, no organomegaly  Genitalia:    deferred  Rectal:    deferred  Extremities:   All extremities are intact. No cyanosis or edema  Pulses:   2+ and symmetric all extremities  Skin:   Skin color, texture, turgor normal, no rashes or lesions  Lymph nodes:   Cervical, supraclavicular, and axillary nodes normal  Neurologic:   CNII-XII intact. Normal strength, sensation and reflexes      Throughout. Delayed speech, minimally conversant.        Last depression screening scores    10/06/2023    9:02 AM 07/23/2023    8:13 AM 04/07/2023    8:56 AM  PHQ 2/9 Scores  PHQ - 2 Score 0 0 1  PHQ- 9 Score 6 0 4   Last fall risk screening    10/06/2023    9:02 AM  Fall Risk   Falls in the past year? 0  Number falls in past yr: 0  Injury with Fall? 0   Last Audit-C alcohol use screening    07/23/2023    8:13 AM  Alcohol Use Disorder  Test (AUDIT)  1. How often do you have a drink containing alcohol? 0  2. How many drinks containing alcohol do you have on a typical day when you are drinking? 0  3. How often do you have six or more drinks on one occasion? 0  AUDIT-C Score 0   A score of 3 or more in women, and 4 or more in men indicates increased risk for alcohol abuse, EXCEPT if all of the points are from question 1   No results found for any visits on 10/06/23.  Assessment & Plan    Routine Health Maintenance and Physical Exam  Exercise Activities and Dietary recommendations  Goals      DIET - EAT MORE FRUITS AND VEGETABLES        Immunization History  Administered Date(s) Administered   Influenza,inj,Quad PF,6+ Mos 03/27/2018   Tdap 07/14/2009    Health Maintenance  Topic Date Due   Lung Cancer Screening  Never done   COVID-19 Vaccine (1 - 2024-25 season) Never done   Zoster Vaccines- Shingrix (1 of 2) 01/06/2024 (Originally 06/29/2012)   INFLUENZA VACCINE  05/11/2024 (Originally 09/12/2023)   DTaP/Tdap/Td (2 - Td or Tdap) 10/05/2024 (Originally 07/15/2019)   Pneumococcal Vaccine: 50+ Years (1 of 2 - PCV) 10/05/2024 (Originally 06/29/1981)   Medicare Annual Wellness (AWV)  07/22/2024   Colonoscopy  10/29/2028   Hepatitis C Screening  Completed   HIV Screening  Completed   Hepatitis B Vaccines 19-59 Average Risk  Aged Out   HPV VACCINES  Aged Out   Meningococcal  B Vaccine  Aged Out    Discussed health benefits of physical activity, and encouraged him to engage in regular exercise appropriate for his age and condition.    2. Primary hypertension He reports home blood pressure that are much lower than office readings.  refill amLODipine  (NORVASC ) 5 MG tablet; Take 1 tablet (5 mg total) by mouth daily.  Dispense: 90 tablet; Refill: 4 - Comprehensive metabolic panel with GFR - CBC - Lipid panel  3. History of CVA with residual deficit Compliant with medication.  Continue aggressive risk factor  modification.  No new neuro deficits. 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   4. Stopped smoking with greater than 30 pack year history  - Ambulatory Referral for Lung Cancer Screening [REF832]  5. Prostate cancer screening  - PSA Total (Reflex To Free)        Nancyann Perry, MD  [facility] [phone] (phone) [phone] (fax)  [facility]

## 2023-10-17 ENCOUNTER — Telehealth: Payer: Self-pay | Admitting: Acute Care

## 2023-10-17 NOTE — Telephone Encounter (Signed)
 Lung Cancer Screening Narrative/Criteria Questionnaire (Cigarette Smokers Only- No Cigars/Pipes/vapes)   Carlin LELON Kluver   SDMV:sister Dayla will set up appt        1960-04-02   LDCT: sister Dayla will set up appt     63 y.o.   Phone: [phone]  Lung Screening Narrative (confirm age 77-77 yrs Medicare / 50-80 yrs Private pay insurance)   Insurance information:[employer] mcr   Referring Provider:Dr. Gasper   This screening involves an initial phone call with a team member from our program. It is called a shared decision making visit. The initial meeting is required by  insurance and Medicare to make sure you understand the program. This appointment takes about 15-20 minutes to complete. You will complete the screening scan at your scheduled date/time.  This scan takes about 5-10 minutes to complete. You can eat and drink normally before and after the scan.  Criteria questions for Lung Cancer Screening:   Are you a current or former smoker? Former Age began smoking: 63yo   If you are a former smoker, what year did you quit smoking? 2020(within 15 yrs)   To calculate your smoking history, I need an accurate estimate of how many packs of cigarettes you smoked per day and for how many years. (Not just the number of PPD you are now smoking)   Years smoking 39 x Packs per day 1 = Pack years 39   (at least 20 pack yrs)   (Make sure they understand that we need to know how much they have smoked in the past, not just the number of PPD they are smoking now)  Do you have a personal history of cancer?  No    Do you have a family history of cancer? Yes  (cancer type and and relative) father - unsure of type  Are you coughing up blood?  No  Have you had unexplained weight loss of 15 lbs or more in the last 6 months? No  It looks like you meet all criteria.  When would be a good time for us  to schedule you for this screening?   Additional information: N/A

## 2023-10-21 NOTE — Telephone Encounter (Signed)
 Called pt's sister back again to set up appts. Unable to leave VM on sisters number [phone]). Called patient's number and left VM.

## 2023-10-22 DIAGNOSIS — I10 Essential (primary) hypertension: Secondary | ICD-10-CM | POA: Diagnosis not present

## 2023-10-23 ENCOUNTER — Ambulatory Visit: Payer: Self-pay | Admitting: Family Medicine

## 2023-10-23 LAB — CBC
Hematocrit: 44.8 % (ref 37.5–51.0)
Hemoglobin: 14.5 g/dL (ref 13.0–17.7)
MCH: 30 pg (ref 26.6–33.0)
MCHC: 32.4 g/dL (ref 31.5–35.7)
MCV: 93 fL (ref 79–97)
Platelets: 260 x10E3/uL (ref 150–450)
RBC: 4.84 x10E6/uL (ref 4.14–5.80)
RDW: 13.1 % (ref 11.6–15.4)
WBC: 7 x10E3/uL (ref 3.4–10.8)

## 2023-10-23 LAB — COMPREHENSIVE METABOLIC PANEL WITH GFR
ALT: 20 IU/L (ref 0–44)
AST: 20 IU/L (ref 0–40)
Albumin: 4.5 g/dL (ref 3.9–4.9)
Alkaline Phosphatase: 86 IU/L (ref 44–121)
BUN/Creatinine Ratio: 16 (ref 10–24)
BUN: 16 mg/dL (ref 8–27)
Bilirubin Total: 0.4 mg/dL (ref 0.0–1.2)
CO2: 23 mmol/L (ref 20–29)
Calcium: 9.7 mg/dL (ref 8.6–10.2)
Chloride: 103 mmol/L (ref 96–106)
Creatinine, Ser: 0.97 mg/dL (ref 0.76–1.27)
Globulin, Total: 2.8 g/dL (ref 1.5–4.5)
Glucose: 94 mg/dL (ref 70–99)
Potassium: 5 mmol/L (ref 3.5–5.2)
Sodium: 142 mmol/L (ref 134–144)
Total Protein: 7.3 g/dL (ref 6.0–8.5)
eGFR: 89 mL/min/1.73 (ref >59)

## 2023-10-23 LAB — PSA TOTAL (REFLEX TO FREE): Prostate Specific Ag, Serum: 2.2 ng/mL (ref 0.0–4.0)

## 2023-10-23 LAB — LIPID PANEL
Chol/HDL Ratio: 2.4 ratio (ref 0.0–5.0)
Cholesterol, Total: 114 mg/dL (ref 100–199)
HDL: 47 mg/dL (ref >39)
LDL Chol Calc (NIH): 55 mg/dL (ref 0–99)
Triglycerides: 50 mg/dL (ref 0–149)
VLDL Cholesterol Cal: 12 mg/dL (ref 5–40)

## 2023-10-27 NOTE — Telephone Encounter (Signed)
 Jeffery Grant called in and left VM. Called Jeffery Grant back with no pick up. Unable to leave VM as VM box is full.

## 2024-08-03 ENCOUNTER — Ambulatory Visit
